# Patient Record
Sex: Male | Born: 1990 | Race: Black or African American | Hispanic: No | Marital: Single | Smoking: Current every day smoker
Health system: Southern US, Community
[De-identification: ages and names within clinical notes are randomized; demographics above are authoritative.]

---

## 2018-11-08 ENCOUNTER — Emergency Department (HOSPITAL_COMMUNITY): Payer: Self-pay

## 2018-11-08 ENCOUNTER — Encounter (HOSPITAL_COMMUNITY): Payer: Self-pay

## 2018-11-08 ENCOUNTER — Other Ambulatory Visit: Payer: Self-pay

## 2018-11-08 ENCOUNTER — Emergency Department (HOSPITAL_COMMUNITY)
Admission: EM | Admit: 2018-11-08 | Discharge: 2018-11-09 | Disposition: A | Discharge status: Home / Self Care | Payer: Self-pay | Attending: Emergency Medicine | Admitting: Emergency Medicine

## 2018-11-08 DIAGNOSIS — M79674 Pain in right toe(s): Secondary | ICD-10-CM | POA: Insufficient documentation

## 2018-11-08 DIAGNOSIS — F1424 Cocaine dependence with cocaine-induced mood disorder: Secondary | ICD-10-CM | POA: Insufficient documentation

## 2018-11-08 DIAGNOSIS — F1994 Other psychoactive substance use, unspecified with psychoactive substance-induced mood disorder: Secondary | ICD-10-CM | POA: Diagnosis present

## 2018-11-08 DIAGNOSIS — R45851 Suicidal ideations: Secondary | ICD-10-CM | POA: Insufficient documentation

## 2018-11-08 DIAGNOSIS — F329 Major depressive disorder, single episode, unspecified: Secondary | ICD-10-CM | POA: Insufficient documentation

## 2018-11-08 MED ORDER — DOXYCYCLINE HYCLATE 100 MG PO TABS
100.0000 mg | ORAL_TABLET | Freq: Once | ORAL | Status: AC
  Administered 2018-11-09: 100 mg via ORAL
  Filled 2018-11-08: qty 1

## 2018-11-08 NOTE — ED Triage Notes (Signed)
Pt reports that he is hungry. He also states that he is thinking about harming himself. No particular plan.  He also reports R foot pain. Pt is ambulatory. States that he has been out in the rain for 2 days.

## 2018-11-08 NOTE — ED Provider Notes (Signed)
[facility]-EMERGENCY DEPT Provider Note   CSN: [id number] Arrival date & time: 11/08/18  1843     History   Chief Complaint Chief Complaint  Patient presents with  . Suicidal  . Foot Pain    R    HPI Paul Campbell is a 28 y.o. male.  The history is provided by the patient. No language interpreter was used.  Foot Pain    Paul Campbell is a 28 y.o. male who presents to the Emergency Department complaining of SI. Patient presents to the emergency department voluntarily for evaluation of suicidal ideation and right foot pain. In terms of SI he states that he feels depressed and has passive suicidal thoughts. He has no plan. He is currently homeless as of a few days ago. He does have a history of prior psychiatric hospitalization one year ago when he was in [place]. He states he has been on medications in the past but is unsure what they are. He denies any medical problems. He smokes cigarettes, drinks occasional alcohol and uses crack cocaine, last use a few days ago. He states that he was in the rain a few days ago and has experienced pain to his right fourth and fifth digits, worse with walking. No fevers, chest pain, nausea, vomiting, abdominal pain, diarrhea.  History reviewed. No pertinent past medical history.  There are no active problems to display for this patient.         Home Medications    Prior to Admission medications   Not on File    Family History History reviewed. No pertinent family history.  Social History Social History   Tobacco Use  . Smoking status: Not on file  Substance Use Topics  . Alcohol use: Not on file  . Drug use: Not on file     Allergies   Patient has no known allergies.   Review of Systems Review of Systems  All other systems reviewed and are negative.    Physical Exam Updated Vital Signs BP (!) 130/91 (BP Location: Left Arm)   Pulse 79   Temp 97.8 F (36.6 C) (Oral)   Resp 18   Ht 5'  5" (1.651 m)   Wt 63.2 kg   SpO2 100%   BMI 23.20 kg/m   Physical Exam Vitals signs and nursing note reviewed.  Constitutional:      Appearance: He is well-developed.  HENT:     Head: Normocephalic and atraumatic.  Cardiovascular:     Rate and Rhythm: Normal rate and regular rhythm.  Pulmonary:     Effort: Pulmonary effort is normal. No respiratory distress.  Musculoskeletal: Normal range of motion.     Comments: 2+ DP pulses bilaterally. There are calluses and ruptured blisters to the right fourth and fifth digits in the inner digit space. There is no significant erythema or edema.  Skin:    General: Skin is warm.  Neurological:     Mental Status: He is alert and oriented to person, place, and time.  Psychiatric:     Comments: Flat affect      ED Treatments / Results  Labs (all labs ordered are listed, but only abnormal results are displayed) Labs Reviewed  COMPREHENSIVE METABOLIC PANEL  ETHANOL  SALICYLATE LEVEL  ACETAMINOPHEN LEVEL  CBC  RAPID URINE DRUG SCREEN, HOSP PERFORMED    EKG None  Radiology No results found.  Procedures Procedures (including critical care time)  Medications Ordered in ED Medications - No data  to display   Initial Impression / Assessment and Plan / ED Course  I have reviewed the triage vital signs and the nursing notes.  Pertinent labs & imaging results that were available during my care of the patient were reviewed by me and considered in my medical decision making (see chart for details).     Patient here for evaluation of suicidal ideation, no plan, history of substance abuse. He is calm and appropriate in the emergency department. He complains of pain to his right foot, he does have blisters with mild local swelling - will treat for possible early cellulitis. Counseled patient on foot hygiene as well as return precautions. He has been medically cleared for psychiatric evaluation and treatment. Patient care transferred pending  psychiatric evaluation.  Final Clinical Impressions(s) / ED Diagnoses   Final diagnoses:  None    ED Discharge Orders    None       Tilden Fossa, MD 11/09/18 [phone]

## 2018-11-09 DIAGNOSIS — F1994 Other psychoactive substance use, unspecified with psychoactive substance-induced mood disorder: Secondary | ICD-10-CM | POA: Diagnosis present

## 2018-11-09 NOTE — ED Notes (Signed)
TTS DAVE PRESENT. HE STATES PT HAS HAD NO BLOOD WORK DRAWN SINCE ARRIVAL. CHARGE MORGAN RN MADE AWARE. EDP NANAVATI MADE AWARE.

## 2018-11-09 NOTE — Discharge Instructions (Signed)
For your mental health needs, you are advised to follow up with [facility].  New and returning patients are seen at their walk-in clinic.  Walk-in hours are Monday - Friday from 8:00 am - 3:00 pm.  Walk-in patients are seen on a first come, first served basis.  Try to arrive as early as possible for the best chance of being seen the same day:       [facility]      [address]      [place], [place] [address]      [phone]  To help you maintain a sober lifestyle, a substance abuse treatment program may be beneficial to you.  Contact one of the following facilities at your earliest opportunity to ask about enrolling:  RESIDENTIAL PROGRAMS:       [facility]      [address] [place], [place] [address]      [phone]       [facility]      [address] [place], [place] [address]      [phone]       [facility]      [address]      [place], [place] [address]      [phone]  OUTPATIENT PROGRAMS:       [facility]      [address], [place] [address]      [phone]      New patients are seen at the walk-in clinic every Tuesday from 9:00 am - 12:00 pm

## 2018-11-09 NOTE — ED Provider Notes (Signed)
Care assumed from Dr. Madilyn Hook, patient with suicidal ideation pending TTS consultation.  TTS consultation is appreciated.  Patient will be held overnight for evaluation by psychiatry in the morning.   Dione Booze, MD 11/09/18 [phone]

## 2018-11-09 NOTE — BH Assessment (Signed)
[facility] Assessment Progress Note  Per Juanetta Beets, DO, this pt does not require psychiatric hospitalization at this time.  Pt is to be discharged from [facility] with recommendation to follow up with [facility] for his mental health needs.  He is also to be provided with information regarding area substance abuse treatment providers.  These resources have been included in pt's discharge instructions.  Pt would also benefit from seeing Peer Support Specialists; they will be asked to speak to pt.  Pt's nurse, Morrie Sheldon, has been notified.  Doylene Canning, MA Triage Specialist [phone]

## 2018-11-09 NOTE — BH Assessment (Addendum)
Assessment Note  Paul Campbell is an 28 y.o. male who presents to the ED voluntarily due to ongoing SI without a plan. Pt states he has been "not feeling like myself" for several weeks. Pt states he is homeless and has been sleeping outside. Pt states he has no resources or support. TTS asked the pt if he has anyone we could contact in order to obtain collateral information and the pt stated he does not have any family or friends that support him. Pt states he moved to [place] from [place] about 2 months ago. Pt does not disclose the reason he moved to [place] but states he does not have any family or support. Pt states he has been "feeling down" and states he does not feel safe. Pt states he has been admitted to inpt facilities in the past in [place] due to depression and thoughts of suicide. Pt endorses daily cocaine use and states he has been binging for the past 2 weeks.  Per Nira Conn, NP pt is recommended for continued observation for safety and stabilization and to be reassessed in the AM by psych due to SI without a definitive plan. EDP Dr. Preston Fleeting, MD and Triage staff Kendal Hymen have been advised.   Diagnosis: Unspecified depressive disorder; Cocaine use disorder, severe  Past Medical History: History reviewed. No pertinent past medical history.   Family History: History reviewed. No pertinent family history.  Social History:  has no history on file for tobacco, alcohol, and drug.  Additional Social History:  Alcohol / Drug Use Pain Medications: See MAR Prescriptions: See MAR Over the Counter: See MAR History of alcohol / drug use?: Yes Substance #1 Name of Substance 1: Cocaine 1 - Age of First Use: 25 1 - Amount (size/oz): varies 1 - Frequency: daily 1 - Duration: ongoing 1 - Last Use / Amount: 11/08/18  CIWA: CIWA-Ar BP: (!) 130/91 Pulse Rate: 79 COWS:    Allergies: No Known Allergies  Home Medications: (Not in a hospital admission)   OB/GYN Status:  No LMP for  male patient.  General Assessment Data Location of Assessment: [facility] ED TTS Assessment: In system Is this a Tele or Face-to-Face Assessment?: Face-to-Face Is this an Initial Assessment or a Re-assessment for this encounter?: Initial Assessment Patient Accompanied by:: N/A Language Other than English: No Living Arrangements: Homeless/Shelter What gender do you identify as?: Male Marital status: Single Pregnancy Status: No Living Arrangements: Alone Can pt return to current living arrangement?: Yes Admission Status: Voluntary Is patient capable of signing voluntary admission?: Yes Referral Source: Self/Family/Friend Insurance type: none     Crisis Care Plan Living Arrangements: Alone Name of Psychiatrist: none Name of Therapist: none  Education Status Is patient currently in school?: No Is the patient employed, unemployed or receiving disability?: Unemployed  Risk to self with the past 6 months Suicidal Ideation: Yes-Currently Present Has patient been a risk to self within the past 6 months prior to admission? : No Suicidal Intent: No Has patient had any suicidal intent within the past 6 months prior to admission? : No Is patient at risk for suicide?: Yes Suicidal Plan?: No Has patient had any suicidal plan within the past 6 months prior to admission? : No Access to Means: No What has been your use of drugs/alcohol within the last 12 months?: cocaine Previous Attempts/Gestures: No Triggers for Past Attempts: None known Intentional Self Injurious Behavior: None Family Suicide History: No Recent stressful life event(s): Financial Problems, Turmoil (Comment)(homeless) Persecutory voices/beliefs?: No Depression:  Yes Depression Symptoms: Insomnia, Feeling worthless/self pity Substance abuse history and/or treatment for substance abuse?: Yes Suicide prevention information given to non-admitted patients: Not applicable  Risk to Others within the past 6 months Homicidal  Ideation: No Does patient have any lifetime risk of violence toward others beyond the six months prior to admission? : No Thoughts of Harm to Others: No Current Homicidal Intent: No Current Homicidal Plan: No Access to Homicidal Means: No History of harm to others?: No Assessment of Violence: None Noted Does patient have access to weapons?: No Criminal Charges Pending?: No Does patient have a court date: No Is patient on probation?: No  Psychosis Hallucinations: None noted Delusions: None noted  Mental Status Report Appearance/Hygiene: In scrubs, Unremarkable Eye Contact: Good Motor Activity: Freedom of movement Speech: Logical/coherent Level of Consciousness: Alert Mood: Depressed Affect: Depressed Anxiety Level: None Thought Processes: Coherent, Relevant Judgement: Partial Orientation: Place, Person, Time, Situation Obsessive Compulsive Thoughts/Behaviors: None  Cognitive Functioning Concentration: Normal Memory: Recent Intact, Remote Intact Is patient IDD: No Insight: Fair Impulse Control: Good Appetite: Good Have you had any weight changes? : No Change Sleep: Decreased Total Hours of Sleep: 4 Vegetative Symptoms: None  ADLScreening [facility] Assessment Services) Patient's cognitive ability adequate to safely complete daily activities?: Yes Patient able to express need for assistance with ADLs?: Yes Independently performs ADLs?: Yes (appropriate for developmental age)  Prior Inpatient Therapy Prior Inpatient Therapy: Yes Prior Therapy Dates: 2018 Prior Therapy Facilty/Provider(s): facility in [place] Reason for Treatment: depression  Prior Outpatient Therapy Prior Outpatient Therapy: Yes Prior Therapy Dates: 2018 Prior Therapy Facilty/Provider(s): facility in [place] Reason for Treatment: MH issues Does patient have an ACCT team?: No Does patient have Intensive In-House Services?  : No Does patient have [facility] services? : No Does patient have [facility] services?:  No  ADL Screening (condition at time of admission) Patient's cognitive ability adequate to safely complete daily activities?: Yes Is the patient deaf or have difficulty hearing?: No Does the patient have difficulty seeing, even when wearing glasses/contacts?: No Does the patient have difficulty concentrating, remembering, or making decisions?: No Patient able to express need for assistance with ADLs?: Yes Does the patient have difficulty dressing or bathing?: No Independently performs ADLs?: Yes (appropriate for developmental age) Does the patient have difficulty walking or climbing stairs?: No Weakness of Legs: None Weakness of Arms/Hands: None  Home Assistive Devices/Equipment Home Assistive Devices/Equipment: None    Abuse/Neglect Assessment (Assessment to be complete while patient is alone) Abuse/Neglect Assessment Can Be Completed: Yes Physical Abuse: Denies Verbal Abuse: Denies Sexual Abuse: Denies Exploitation of patient/patient's resources: Denies Self-Neglect: Denies     Merchant navy officer (For Healthcare) Does Patient Have a Medical Advance Directive?: No Would patient like information on creating a medical advance directive?: No - Patient declined          Disposition: Per Nira Conn, NP pt is recommended for continued observation for safety and stabilization and to be reassessed in the AM by psych due to SI without a definitive plan. EDP Dr. Preston Fleeting, MD and Triage staff Kendal Hymen have been advised.  Disposition Initial Assessment Completed for this Encounter: Yes Disposition of Patient: (overnight OBS pending AM psych assessment) Patient refused recommended treatment: No  On Site Evaluation by:   Reviewed with Physician:    Karolee Ohs 11/09/2018 1:06 AM

## 2018-11-09 NOTE — Progress Notes (Signed)
Received Paul Campbell from [facility] alert and immediately went to bed. He continued to sleep throughout since his arrival to the unit.

## 2018-11-09 NOTE — ED Notes (Signed)
Patient is alert and oriented to person, place and time.  Patient currently denies suicidal thoughts, auditory and visual hallucinations.  Routine safety checks maintained every 15 minutes and via security camera.  Discharge instructions reviewed with patient.  Verbalizes understanding and readiness for discharge.

## 2018-11-09 NOTE — Progress Notes (Signed)
Per Nira Conn, NP pt is recommended for continued observation for safety and stabilization and to be reassessed in the AM by psych due to SI without a definitive plan. EDP Dr. Preston Fleeting, MD and Triage staff Kendal Hymen have been advised.   Princess Bruins, MSW, LCSW Therapeutic Triage Specialist  [phone]

## 2018-11-09 NOTE — Patient Outreach (Signed)
ED Peer Support Specialist Patient Intake (Complete at intake & 30-60 Day Follow-up)  Name: Paul Campbell  MRN: [id number]  Age: 28 y.o.   Date of Admission: 11/09/2018  Intake: Initial Comments:      Primary Reason Admitted: Suicidal, Foot Pain   Lab values: Alcohol/ETOH: Not completed Positive UDS? Drug screen not completed Amphetamines: Drug screen not completed Barbiturates: Drug screen not completed Benzodiazepines: Drug screen not completed Cocaine: Drug screen not completed Opiates: Drug screen not completed Cannabinoids: Drug screen not completed  Demographic information: Gender: Male Ethnicity: African American Marital Status: Single Insurance Status: Diplomatic Services operational officer (Work Neurosurgeon, Physicist, medical, etc.: No Lives with: Alone Living situation: House/Apartment  Reported Patient History: Patient reported health conditions: Depression Patient aware of HIV and hepatitis status: No  In past year, has patient visited ED for any reason? No  Number of ED visits:    Reason(s) for visit:    In past year, has patient been hospitalized for any reason? No  Number of hospitalizations:    Reason(s) for hospitalization:    In past year, has patient been arrested? No  Number of arrests:    Reason(s) for arrest:    In past year, has patient been incarcerated? No  Number of incarcerations:    Reason(s) for incarceration:    In past year, has patient received medication-assisted treatment? No  In past year, patient received the following treatments:    In past year, has patient received any harm reduction services? No  Did this include any of the following?    In past year, has patient received care from a mental health provider for diagnosis other than SUD? No  In past year, is this first time patient has overdosed? No  Number of past overdoses:    In past year, is this first time patient has been hospitalized for  an overdose? No  Number of hospitalizations for overdose(s):    Is patient currently receiving treatment for a mental health diagnosis? No  Patient reports experiencing difficulty participating in SUD treatment: No    Most important reason(s) for this difficulty?    Has patient received prior services for treatment? No  In past, patient has received services from following agencies:    Plan of Care:  Suggested follow up at these agencies/treatment centers: Other (comment)(Wants to fell better about himself.)  Other information: CPSS met with Pt an was able to process with Pt to gain information to better assist Pt. CPSS was made aware that Pt had thoughts but does not have a plan to harm himself. Pt discussed the fact that he had spoke about harming himself but does not really want to do so. Pt was made aware that he will receive contact information for outpatient services and resources that will benefit Pt in the community.    Aaron Edelman Lorene Samaan, CPSS  11/09/2018 11:11 AM

## 2018-11-09 NOTE — ED Notes (Signed)
Pt c/o left foot pain, MD aware. Pt denies any hi and avh at this time. Pt c/o of si without a plan. Pt contract to safety. Pt cooperative and calm. Pt safe, will continue to monitor.

## 2018-11-09 NOTE — ED Notes (Signed)
PSYCH DO AND TEAM  Provider at bedside. 

## 2018-11-09 NOTE — ED Notes (Signed)
Bed: WA32 Expected date:  Expected time:  Means of arrival:  Comments: 

## 2018-11-09 NOTE — B[facility] Suicide Risk Assessment (Signed)
Suicide Risk Assessment  Discharge Assessment   [facility] Discharge Suicide Risk Assessment   Principal Problem: Substance induced mood disorder ([place]) Discharge Diagnoses: Principal Problem:   Substance induced mood disorder ([place])   Total Time spent with patient: 45 minutes  Musculoskeletal: Strength & Muscle Tone: within normal limits Gait & Station: normal Patient leans: N/A  Psychiatric Specialty Exam:   Blood pressure 117/74, pulse (!) 52, temperature 98 F (36.7 C), temperature source Oral, resp. rate 15, height _0  (1.651 m), weight 63.2 kg, SpO2 99 %.Body mass index is 23.2 kg/m.  General Appearance: Casual  Eye Contact::  Good  Speech:  Normal Rate409  Volume:  Normal  Mood:  Irritable  Affect:  Congruent  Thought Process:  Coherent and Descriptions of Associations: Intact  Orientation:  Full (Time, Place, and Person)  Thought Content:  WDL and Logical  Suicidal Thoughts:  No  Homicidal Thoughts:  No  Memory:  Immediate;   Good Recent;   Good Remote;   Good  Judgement:  Fair  Insight:  Fair  Psychomotor Activity:  Normal  Concentration:  Good  Recall:  Good  Fund of Knowledge:Good  Language: Good  Akathisia:  No  Handed:  Right  AIMS (if indicated):     Assets:  Leisure Time Physical Health Resilience  Sleep:     Cognition: WNL  ADL's:  Intact   Mental Status Per Nursing Assessment::   On Admission:   28 yo male who presented to ED for foot pain after using drugs, refused an UDS, and having passive suicidal ideations.  No hallucinations or withdrawal symptoms.  On assessment, his suicidal ideations were intermittent, passive with no intent or plan.  Denies past attempts.  Met with Peer support.  Irritable on assessment.  Foot was assessed and cleared by the EP, stable psychiatrically.  Encouraged to follow up with rehab/recovery resources.  Demographic Factors:  Male and Adolescent or young adult  Loss Factors: Legal issues  Historical  Factors: NA  Risk Reduction Factors:   Sense of responsibility to family, Positive social support and Positive therapeutic relationship  Continued Clinical Symptoms:  Irritable   Cognitive Features That Contribute To Risk:  None    Suicide Risk:  Minimal: No identifiable suicidal ideation.  Patients presenting with no risk factors but with morbid ruminations; may be classified as minimal risk based on the severity of the depressive symptoms    Plan Of Care/Follow-up recommendations:  Activity:  as tolerated Diet:  heart healthy diet  Helga Asbury, NP 11/09/2018, 11:39 AM

## 2018-11-13 ENCOUNTER — Encounter (HOSPITAL_COMMUNITY): Payer: Self-pay | Admitting: Emergency Medicine

## 2018-11-13 ENCOUNTER — Other Ambulatory Visit: Payer: Self-pay

## 2018-11-13 ENCOUNTER — Emergency Department (HOSPITAL_COMMUNITY): Payer: Self-pay

## 2018-11-13 ENCOUNTER — Emergency Department (HOSPITAL_COMMUNITY)
Admission: EM | Admit: 2018-11-13 | Discharge: 2018-11-13 | Disposition: A | Discharge status: Home / Self Care | Payer: Self-pay | Attending: Emergency Medicine | Admitting: Emergency Medicine

## 2018-11-13 DIAGNOSIS — R079 Chest pain, unspecified: Secondary | ICD-10-CM | POA: Insufficient documentation

## 2018-11-13 DIAGNOSIS — F1490 Cocaine use, unspecified, uncomplicated: Secondary | ICD-10-CM | POA: Insufficient documentation

## 2018-11-13 DIAGNOSIS — F17200 Nicotine dependence, unspecified, uncomplicated: Secondary | ICD-10-CM | POA: Insufficient documentation

## 2018-11-13 LAB — BASIC METABOLIC PANEL
ANION GAP: 12 (ref 5–15)
BUN: 14 mg/dL (ref 6–20)
CO2: 24 mmol/L (ref 22–32)
Calcium: 9 mg/dL (ref 8.9–10.3)
Chloride: 100 mmol/L (ref 98–111)
Creatinine, Ser: 0.95 mg/dL (ref 0.61–1.24)
GFR calc Af Amer: >60 mL/min (ref >60)
GFR calc non Af Amer: >60 mL/min (ref >60)
Glucose, Bld: 85 mg/dL (ref 70–99)
Potassium: 3.5 mmol/L (ref 3.5–5.1)
Sodium: 136 mmol/L (ref 135–145)

## 2018-11-13 LAB — CBC WITH DIFFERENTIAL/PLATELET
ABS IMMATURE GRANULOCYTES: 0.02 10*3/uL (ref 0.00–0.07)
Basophils Absolute: 0 10*3/uL (ref 0.0–0.1)
Basophils Relative: 0 %
Eosinophils Absolute: 0.1 10*3/uL (ref 0.0–0.5)
Eosinophils Relative: 1 %
HCT: 41.3 % (ref 39.0–52.0)
Hemoglobin: 13.6 g/dL (ref 13.0–17.0)
Immature Granulocytes: 0 %
LYMPHS ABS: 2.5 10*3/uL (ref 0.7–4.0)
Lymphocytes Relative: 34 %
MCH: 30.7 pg (ref 26.0–34.0)
MCHC: 32.9 g/dL (ref 30.0–36.0)
MCV: 93.2 fL (ref 80.0–100.0)
Monocytes Absolute: 0.5 10*3/uL (ref 0.1–1.0)
Monocytes Relative: 7 %
Neutro Abs: 4.3 10*3/uL (ref 1.7–7.7)
Neutrophils Relative %: 58 %
Platelets: 249 10*3/uL (ref 150–400)
RBC: 4.43 MIL/uL (ref 4.22–5.81)
RDW: 13.9 % (ref 11.5–15.5)
WBC: 7.5 10*3/uL (ref 4.0–10.5)
nRBC: 0 % (ref 0.0–0.2)

## 2018-11-13 LAB — I-STAT TROPONIN, ED: Troponin i, poc: 0.01 ng/mL (ref 0.00–0.08)

## 2018-11-13 LAB — RAPID URINE DRUG SCREEN, HOSP PERFORMED
Amphetamines: NOT DETECTED
Barbiturates: NOT DETECTED
Benzodiazepines: NOT DETECTED
COCAINE: POSITIVE — AB
Opiates: NOT DETECTED
Tetrahydrocannabinol: NOT DETECTED

## 2018-11-13 LAB — SALICYLATE LEVEL: Salicylate Lvl: <7 mg/dL (ref 2.8–30.0)

## 2018-11-13 LAB — ETHANOL: Alcohol, Ethyl (B): 13 mg/dL — ABNORMAL HIGH (ref <10)

## 2018-11-13 NOTE — Discharge Instructions (Signed)
Like we discussed, highly recommend to stop smoking and using cocaine.  Both of these are very bad for your heart. Follow-up with your primary care doctor. Return here for any new/acute changes.

## 2018-11-13 NOTE — ED Provider Notes (Signed)
[facility] EMERGENCY DEPARTMENT Provider Note   CSN: [id number] Arrival date & time: 11/13/18  0430     History   Chief Complaint Chief Complaint  Patient presents with  . Chest Pain    HPI Paul Campbell is a 28 y.o. male.  The history is provided by the patient and medical records.  Chest Pain     28 year old male with history of substance-induced mood disorder, presenting to the ED with chest pain.  This began just a few hours ago.  Midsternal in nature, described as a "pressure".  He denies any radiation of pain.  No shortness of breath, palpitations, dizziness, diaphoresis, nausea, or vomiting.  He has no known cardiac history.  Does have family cardiac history--reports several family members have passed away from heart attacks and cardiac complications.  He does smoke on a daily basis, about 5 cigarettes a day.  Also admits to occasional use of cocaine, he did use a few hours ago prior to onset of pain.  States usually no chest pain after doing cocaine.  Also admits he drank 1 beer.  History reviewed. No pertinent past medical history.  Patient Active Problem List   Diagnosis Date Noted  . Substance induced mood disorder (HCC) 11/09/2018    History reviewed. No pertinent surgical history.      Home Medications    Prior to Admission medications   Not on File    Family History No family history on file.  Social History Social History   Tobacco Use  . Smoking status: Current Every Day Smoker  . Smokeless tobacco: Never Used  Substance Use Topics  . Alcohol use: Yes  . Drug use: Yes    Types: Cocaine     Allergies   Patient has no known allergies.   Review of Systems Review of Systems  Cardiovascular: Positive for chest pain.  All other systems reviewed and are negative.    Physical Exam Updated Vital Signs BP 116/85 (BP Location: Right Arm)   Pulse (!) 56   Temp 97.6 F (36.4 C) (Oral)   Resp 16   SpO2 100%    Physical Exam Vitals signs and nursing note reviewed.  Constitutional:      Appearance: He is well-developed.  HENT:     Head: Normocephalic and atraumatic.  Eyes:     Conjunctiva/sclera: Conjunctivae normal.     Pupils: Pupils are equal, round, and reactive to light.  Neck:     Musculoskeletal: Normal range of motion.  Cardiovascular:     Rate and Rhythm: Normal rate and regular rhythm.     Heart sounds: Normal heart sounds.  Pulmonary:     Effort: Pulmonary effort is normal. No accessory muscle usage.     Breath sounds: Normal breath sounds. No decreased breath sounds or wheezing.  Abdominal:     General: Bowel sounds are normal.     Palpations: Abdomen is soft.  Musculoskeletal: Normal range of motion.  Skin:    General: Skin is warm and dry.  Neurological:     Mental Status: He is alert and oriented to person, place, and time.      ED Treatments / Results  Labs (all labs ordered are listed, but only abnormal results are displayed) Labs Reviewed  ETHANOL - Abnormal; Notable for the following components:      Result Value   Alcohol, Ethyl (B) 13 (*)    All other components within normal limits  RAPID URINE DRUG SCREEN, HOSP  PERFORMED - Abnormal; Notable for the following components:   Cocaine POSITIVE (*)    All other components within normal limits  CBC WITH DIFFERENTIAL/PLATELET  BASIC METABOLIC PANEL  SALICYLATE LEVEL  I-STAT TROPONIN, ED    EKG None  Radiology Dg Chest 2 View  Result Date: 11/13/2018 CLINICAL DATA:  Chest pain EXAM: CHEST - 2 VIEW COMPARISON:  None. FINDINGS: Normal heart size and mediastinal contours. No acute infiltrate or edema. No effusion or pneumothorax. No acute osseous findings. IMPRESSION: Negative chest. Electronically Signed   By: Marnee Spring M.D.   On: 11/13/2018 05:26    Procedures Procedures (including critical care time)  Medications Ordered in ED Medications - No data to display   Initial Impression /  Assessment and Plan / ED Course  I have reviewed the triage vital signs and the nursing notes.  Pertinent labs & imaging results that were available during my care of the patient were reviewed by me and considered in my medical decision making (see chart for details).  28 year old male here with chest pain.  Began last evening.  Does report using cocaine prior to onset of pain.  He uses cocaine fairly often but usually without chest pain.  EKG non-ischemic, looks to be early repolarization.  VSS.  Labs pending along with CXR.  Will monitor.  6:25 AM Labs overall reassuring-- trop negative.  Ethanol 13.  UDS + for cocaine.  CXR clear.  On recheck, patient resting comfortably in room.  He is in no acute distress.  Vitals remained stable.  Results discussed with him, he feels reassured.  He is requesting sandwich.  Discussed cessation of cocaine and smoking.  Can follow-up with PCP.   Return here for any new/acute changes.  Final Clinical Impressions(s) / ED Diagnoses   Final diagnoses:  Chest pain in adult  Cocaine use    ED Discharge Orders    None       Garlon Hatchet, PA-C 11/13/18 0712    Nicanor Alcon, April, MD 11/13/18 [phone]

## 2018-11-13 NOTE — ED Triage Notes (Signed)
Pt BIB [facility], c/o sudden onset chest pain while walking tonight. Also admits to using cocaine tonight. Given 324mg  asa and 2 NTG PTA. Pain from a 10 to five after NTG.

## 2018-11-15 ENCOUNTER — Emergency Department (HOSPITAL_COMMUNITY): Payer: Self-pay

## 2018-11-15 ENCOUNTER — Encounter (HOSPITAL_COMMUNITY): Payer: Self-pay | Admitting: Emergency Medicine

## 2018-11-15 ENCOUNTER — Emergency Department (HOSPITAL_COMMUNITY)
Admission: EM | Admit: 2018-11-15 | Discharge: 2018-11-15 | Disposition: A | Discharge status: Home / Self Care | Payer: Self-pay | Attending: Emergency Medicine | Admitting: Emergency Medicine

## 2018-11-15 ENCOUNTER — Other Ambulatory Visit: Payer: Self-pay

## 2018-11-15 DIAGNOSIS — F1994 Other psychoactive substance use, unspecified with psychoactive substance-induced mood disorder: Secondary | ICD-10-CM | POA: Insufficient documentation

## 2018-11-15 DIAGNOSIS — R0789 Other chest pain: Secondary | ICD-10-CM | POA: Insufficient documentation

## 2018-11-15 DIAGNOSIS — Z5900 Homelessness unspecified: Secondary | ICD-10-CM

## 2018-11-15 DIAGNOSIS — F17200 Nicotine dependence, unspecified, uncomplicated: Secondary | ICD-10-CM | POA: Insufficient documentation

## 2018-11-15 LAB — BASIC METABOLIC PANEL
Anion gap: 8 (ref 5–15)
BUN: 14 mg/dL (ref 6–20)
CALCIUM: 8.8 mg/dL — AB (ref 8.9–10.3)
CO2: 26 mmol/L (ref 22–32)
Chloride: 103 mmol/L (ref 98–111)
Creatinine, Ser: 0.83 mg/dL (ref 0.61–1.24)
GFR calc Af Amer: >60 mL/min (ref >60)
GFR calc non Af Amer: >60 mL/min (ref >60)
Glucose, Bld: 86 mg/dL (ref 70–99)
Potassium: 3.7 mmol/L (ref 3.5–5.1)
Sodium: 137 mmol/L (ref 135–145)

## 2018-11-15 LAB — POCT I-STAT TROPONIN I: Troponin i, poc: 0 ng/mL (ref 0.00–0.08)

## 2018-11-15 LAB — CBC
HCT: 41.3 % (ref 39.0–52.0)
Hemoglobin: 13.7 g/dL (ref 13.0–17.0)
MCH: 31.1 pg (ref 26.0–34.0)
MCHC: 33.2 g/dL (ref 30.0–36.0)
MCV: 93.9 fL (ref 80.0–100.0)
Platelets: 273 10*3/uL (ref 150–400)
RBC: 4.4 MIL/uL (ref 4.22–5.81)
RDW: 14 % (ref 11.5–15.5)
WBC: 6.9 10*3/uL (ref 4.0–10.5)
nRBC: 0 % (ref 0.0–0.2)

## 2018-11-15 LAB — RAPID URINE DRUG SCREEN, HOSP PERFORMED
Amphetamines: NOT DETECTED
Barbiturates: NOT DETECTED
Benzodiazepines: NOT DETECTED
Cocaine: POSITIVE — AB
Opiates: NOT DETECTED
Tetrahydrocannabinol: POSITIVE — AB

## 2018-11-15 MED ORDER — SODIUM CHLORIDE 0.9% FLUSH: 3.0000 mL | Freq: Once | INTRAVENOUS | Status: DC

## 2018-11-15 MED ORDER — IBUPROFEN 200 MG PO TABS
600.0000 mg | ORAL_TABLET | Freq: Once | ORAL | Status: AC
  Administered 2018-11-15: 600 mg via ORAL
  Filled 2018-11-15: qty 3

## 2018-11-15 NOTE — Discharge Instructions (Addendum)
Please stop doing cocaine.  You can take ibuprofen 600 mg 4 times a day for pain.  Please look at the resource guide to help you find a place to stay while you are homeless.

## 2018-11-15 NOTE — ED Notes (Signed)
Pt informed by MD that he cannot have anything to eat right now

## 2018-11-15 NOTE — ED Notes (Signed)
Urine and culture sent to lab  

## 2018-11-15 NOTE — ED Provider Notes (Signed)
[facility]   CSN: [id number] Arrival date & time: 11/15/18  0416  Time seen 5:10 AM   History   Chief Complaint Chief Complaint  Patient presents with  . Chest Pain    HPI Paul Campbell is a 28 y.o. male.  HPI patient states he was walking outside around 1 AM because he is homeless and he had pain in the center of his chest that he describes as sharp.  He denies shortness of breath or diaphoresis or nausea or vomiting.  Patient states "my chest always hurts".  He denies using any cocaine tonight but and when he was seen in the ED on February 14 he was having chest pain after using cocaine.  He states "I know I have heart problems because my family does".  When asking what kind of heart problems he thinks he has he cannot answer.  Patient states he does not do cocaine on a regular basis.  Patient states he has been homeless about a week.  This is his third ED visit in 1 week.  He states he moved down here from [place] about a month ago and was staying with a friend however it did not work out.  He states he has had jobs in the past in [place] but he is currently unemployed.  He denies being on disability.  He states he does not want to answer these kind of questions however I pointed out to him he has been here 3 times in a week and we need to know what is  going on.  PCP Patient, No Pcp Per   History reviewed. No pertinent past medical history.  Patient Active Problem List   Diagnosis Date Noted  . Substance induced mood disorder (HCC) 11/09/2018    History reviewed. No pertinent surgical history.      Home Medications    Prior to Admission medications   Not on File    Family History History reviewed. No pertinent family history.  Social History Social History   Tobacco Use  . Smoking status: Current Every Day Smoker  . Smokeless tobacco: Never Used  Substance Use Topics  . Alcohol use: Yes  . Drug  use: Yes    Types: Cocaine  unemployed  Homeless    Allergies   Patient has no known allergies.   Review of Systems Review of Systems  All other systems reviewed and are negative.    Physical Exam Updated Vital Signs BP 131/77 (BP Location: Left Arm)   Pulse (!) 54   Temp 97.7 F (36.5 C) (Oral)   Resp 18   Ht 5\' 5"  (1.651 m)   Wt 63.5 kg   SpO2 98%   BMI 23.30 kg/m   Physical Exam Vitals signs and nursing note reviewed.  Constitutional:      General: He is not in acute distress.    Appearance: Normal appearance. He is well-developed. He is not ill-appearing or toxic-appearing.     Comments: Patient is in no distress watching TV.  HENT:     Head: Normocephalic and atraumatic.     Right Ear: External ear normal.     Left Ear: External ear normal.     Nose: Nose normal. No mucosal edema or rhinorrhea.     Mouth/Throat:     Dentition: No dental abscesses.     Pharynx: No uvula swelling.  Eyes:     Conjunctiva/sclera: Conjunctivae normal.     Pupils:  Pupils are equal, round, and reactive to light.  Neck:     Musculoskeletal: Full passive range of motion without pain, normal range of motion and neck supple.  Cardiovascular:     Rate and Rhythm: Normal rate and regular rhythm.     Heart sounds: Normal heart sounds. No murmur. No friction rub. No gallop.   Pulmonary:     Effort: Pulmonary effort is normal. No respiratory distress.     Breath sounds: Normal breath sounds. No wheezing, rhonchi or rales.  Chest:     Chest wall: Tenderness present. No crepitus.    Abdominal:     General: Bowel sounds are normal. There is no distension.     Palpations: Abdomen is soft.     Tenderness: There is no abdominal tenderness. There is no guarding or rebound.  Musculoskeletal: Normal range of motion.        General: No tenderness.     Comments: Moves all extremities well.   Skin:    General: Skin is warm and dry.     Coloration: Skin is not pale.     Findings: No  erythema or rash.  Neurological:     General: No focal deficit present.     Mental Status: He is alert and oriented to person, place, and time.     Cranial Nerves: No cranial nerve deficit.  Psychiatric:        Mood and Affect: Mood normal. Mood is not anxious.        Speech: Speech normal.        Behavior: Behavior normal.        Thought Content: Thought content normal.      ED Treatments / Results  Labs (all labs ordered are listed, but only abnormal results are displayed) Results for orders placed or performed during the hospital encounter of 11/15/18  Basic metabolic panel  Result Value Ref Range   Sodium 137 135 - 145 mmol/L   Potassium 3.7 3.5 - 5.1 mmol/L   Chloride 103 98 - 111 mmol/L   CO2 26 22 - 32 mmol/L   Glucose, Bld 86 70 - 99 mg/dL   BUN 14 6 - 20 mg/dL   Creatinine, Ser 2.23 0.61 - 1.24 mg/dL   Calcium 8.8 (L) 8.9 - 10.3 mg/dL   GFR calc non Af Amer >60 >60 mL/min   GFR calc Af Amer >60 >60 mL/min   Anion gap 8 5 - 15  CBC  Result Value Ref Range   WBC 6.9 4.0 - 10.5 K/uL   RBC 4.40 4.22 - 5.81 MIL/uL   Hemoglobin 13.7 13.0 - 17.0 g/dL   HCT 36.1 22.4 - 49.7 %   MCV 93.9 80.0 - 100.0 fL   MCH 31.1 26.0 - 34.0 pg   MCHC 33.2 30.0 - 36.0 g/dL   RDW 53.0 05.1 - 10.2 %   Platelets 273 150 - 400 K/uL   nRBC 0.0 0.0 - 0.2 %  Urine rapid drug screen (hosp performed)  Result Value Ref Range   Opiates NONE DETECTED NONE DETECTED   Cocaine POSITIVE (A) NONE DETECTED   Benzodiazepines NONE DETECTED NONE DETECTED   Amphetamines NONE DETECTED NONE DETECTED   Tetrahydrocannabinol POSITIVE (A) NONE DETECTED   Barbiturates NONE DETECTED NONE DETECTED  POCT i-Stat troponin I  Result Value Ref Range   Troponin i, poc 0.00 0.00 - 0.08 ng/mL   Comment 3           Laboratory interpretation all normal  except +UDS    EKG EKG Interpretation  Date/Time:  Sunday November 15 2018 04:25:26 EST Ventricular Rate:  52 PR Interval:    QRS Duration: 87 QT  Interval:  441 QTC Calculation: 411 R Axis:   78 Text Interpretation:  Sinus rhythm LVH by voltage No significant change since last tracing 13 Nov 2018 Confirmed by Devoria Albe ([id number]) on 11/15/2018 4:38:58 AM   Radiology Dg Chest 2 View  Result Date: 11/15/2018 CLINICAL DATA:  Initial evaluation for acute chest pressure. EXAM: CHEST - 2 VIEW COMPARISON:  Prior radiograph from 11/13/2018 FINDINGS: The cardiac and mediastinal silhouettes are stable in size and contour, and remain within normal limits. The lungs are normally inflated. No airspace consolidation, pleural effusion, or pulmonary edema is identified. There is no pneumothorax. No acute osseous abnormality identified. IMPRESSION: No active cardiopulmonary disease. Electronically Signed   By: Rise Mu M.D.   On: 11/15/2018 04:47    Procedures Procedures (including critical care time)  Medications Ordered in ED Medications  sodium chloride flush (NS) 0.9 % injection 3 mL (has no administration in time range)  ibuprofen (ADVIL,MOTRIN) tablet 600 mg (600 mg Oral Given 11/15/18 0541)     Initial Impression / Assessment and Plan / ED Course  I have reviewed the triage vital signs and the nursing notes.  Pertinent labs & imaging results that were available during my care of the patient were reviewed by me and considered in my medical decision making (see chart for details).     Patient had already had a cardiac evaluation on the 14th.  His initial troponin here is negative and is a 4-hour sample.  His chest pain is reproducible, I am going to discharge patient.  Please note that his main concern was getting something to eat.  Final Clinical Impressions(s) / ED Diagnoses   Final diagnoses:  Chest wall pain  Homelessness    Plan discharge  Devoria Albe, MD, [facility]   ED Discharge Orders    None       Devoria Albe, MD 11/15/18 [phone]

## 2018-11-15 NOTE — ED Notes (Addendum)
Pt alert and ambulatory. Given and reviewed discharge instructions. Pt threw discharge instructions across the room. Pt informed that he needs to stop using cocaine which upset pt

## 2018-11-15 NOTE — ED Notes (Addendum)
Pt requesting a sandwich. Informed he cannot have one until a provider speaks with him and his lab work/ xray comes back.

## 2018-11-15 NOTE — ED Notes (Addendum)
Pt walked out the department screaming "yall [email or web address] all fucking liars"

## 2018-11-16 ENCOUNTER — Emergency Department (HOSPITAL_COMMUNITY)
Admission: EM | Admit: 2018-11-16 | Discharge: 2018-11-16 | Disposition: A | Discharge status: Home / Self Care | Payer: Self-pay | Attending: Emergency Medicine | Admitting: Emergency Medicine

## 2018-11-16 ENCOUNTER — Emergency Department (HOSPITAL_COMMUNITY): Payer: Self-pay

## 2018-11-16 ENCOUNTER — Encounter (HOSPITAL_COMMUNITY): Payer: Self-pay

## 2018-11-16 DIAGNOSIS — F17200 Nicotine dependence, unspecified, uncomplicated: Secondary | ICD-10-CM | POA: Insufficient documentation

## 2018-11-16 DIAGNOSIS — M79671 Pain in right foot: Secondary | ICD-10-CM | POA: Insufficient documentation

## 2018-11-16 MED ORDER — IBUPROFEN 800 MG PO TABS: 800.0000 mg | ORAL_TABLET | Freq: Three times a day (TID) | ORAL | 0 refills | Status: DC | PRN

## 2018-11-16 NOTE — ED Notes (Signed)
ED Provider at bedside. 

## 2018-11-16 NOTE — ED Triage Notes (Signed)
Pt presents with c/o right foot pain. Pt reports that his foot has been bothering him for approx one month, pain with walking, no injuries.

## 2018-11-16 NOTE — Discharge Instructions (Addendum)
REturn here as needed. Follow up with the foot specialist provided. Your x-rays were normal.

## 2018-11-16 NOTE — ED Provider Notes (Signed)
[facility]-EMERGENCY DEPT Provider Note   CSN: [id number] Arrival date & time: 11/16/18  0827     History   Chief Complaint Chief Complaint  Patient presents with  . Foot Pain    HPI Paul Campbell is a 28 y.o. male.  HPI Patient presents to the emergency department with right foot pain at the base of the fourth and fifth toes.  The patient states that he has been bothering him for over a month.  Patient states that he is not had any injuries noted.  The patient states that palpation and certain movements make the pain worse.  The patient states he did not take any medications prior to arrival for his symptoms.  Patient denies any fevers, nausea, vomiting, back pain, weakness or syncope.Marland Kitchen History reviewed. No pertinent past medical history.  Patient Active Problem List   Diagnosis Date Noted  . Substance induced mood disorder (HCC) 11/09/2018    History reviewed. No pertinent surgical history.      Home Medications    Prior to Admission medications   Not on File    Family History History reviewed. No pertinent family history.  Social History Social History   Tobacco Use  . Smoking status: Current Every Day Smoker  . Smokeless tobacco: Never Used  Substance Use Topics  . Alcohol use: Yes  . Drug use: Yes    Types: Cocaine     Allergies   Patient has no known allergies.   Review of Systems Review of Systems All other systems negative except as documented in the HPI. All pertinent positives and negatives as reviewed in the HPI.  Physical Exam Updated Vital Signs BP 126/89 (BP Location: Left Arm)   Pulse (!) 55   Temp 97.7 F (36.5 C) (Oral)   Resp 18   Ht 5\' 5"  (1.651 m)   Wt 61.2 kg   SpO2 100%   BMI 22.47 kg/m   Physical Exam Vitals signs and nursing note reviewed.  Constitutional:      General: He is not in acute distress.    Appearance: He is well-developed.  HENT:     Head: Normocephalic and atraumatic.  Eyes:      Pupils: Pupils are equal, round, and reactive to light.  Pulmonary:     Effort: Pulmonary effort is normal.  Musculoskeletal:       Feet:  Skin:    General: Skin is warm and dry.  Neurological:     Mental Status: He is alert and oriented to person, place, and time.      ED Treatments / Results  Labs (all labs ordered are listed, but only abnormal results are displayed) Labs Reviewed - No data to display  EKG None  Radiology Dg Chest 2 View  Result Date: 11/15/2018 CLINICAL DATA:  Initial evaluation for acute chest pressure. EXAM: CHEST - 2 VIEW COMPARISON:  Prior radiograph from 11/13/2018 FINDINGS: The cardiac and mediastinal silhouettes are stable in size and contour, and remain within normal limits. The lungs are normally inflated. No airspace consolidation, pleural effusion, or pulmonary edema is identified. There is no pneumothorax. No acute osseous abnormality identified. IMPRESSION: No active cardiopulmonary disease. Electronically Signed   By: Rise Mu M.D.   On: 11/15/2018 04:47   Dg Foot Complete Right  Result Date: 11/16/2018 CLINICAL DATA:  Lateral foot pain for 1 month.  No injury. EXAM: RIGHT FOOT COMPLETE - 3+ VIEW COMPARISON:  11/08/2018 FINDINGS: There is no evidence of fracture or  dislocation. There is no evidence of arthropathy or other focal bone abnormality. Soft tissues are unremarkable. IMPRESSION: Negative. Electronically Signed   By: Amie Portland M.D.   On: 11/16/2018 09:42    Procedures Procedures (including critical care time)  Medications Ordered in ED Medications - No data to display   Initial Impression / Assessment and Plan / ED Course  I have reviewed the triage vital signs and the nursing notes.  Pertinent labs & imaging results that were available during my care of the patient were reviewed by me and considered in my medical decision making (see chart for details).    Patient is referred to podiatry.  Have advised him to  keep the area elevated and ice as well.  Motrin for pain.  Final Clinical Impressions(s) / ED Diagnoses   Final diagnoses:  None    ED Discharge Orders    None       Charlestine Night, PA-C 11/20/18 2230    Melene Plan, DO 11/20/18 2258

## 2018-11-17 ENCOUNTER — Emergency Department (HOSPITAL_COMMUNITY): Admission: EM | Admit: 2018-11-17 | Discharge: 2018-11-17 | Discharge status: Absent without leave | Payer: Self-pay

## 2018-11-17 ENCOUNTER — Encounter (HOSPITAL_COMMUNITY): Payer: Self-pay

## 2018-11-17 ENCOUNTER — Emergency Department (HOSPITAL_COMMUNITY)
Admission: EM | Admit: 2018-11-17 | Discharge: 2018-11-17 | Disposition: A | Discharge status: Home / Self Care | Payer: Self-pay | Attending: Emergency Medicine | Admitting: Emergency Medicine

## 2018-11-17 DIAGNOSIS — Z008 Encounter for other general examination: Secondary | ICD-10-CM | POA: Insufficient documentation

## 2018-11-17 DIAGNOSIS — Z5900 Homelessness unspecified: Secondary | ICD-10-CM

## 2018-11-17 DIAGNOSIS — F329 Major depressive disorder, single episode, unspecified: Secondary | ICD-10-CM | POA: Insufficient documentation

## 2018-11-17 DIAGNOSIS — F17200 Nicotine dependence, unspecified, uncomplicated: Secondary | ICD-10-CM | POA: Insufficient documentation

## 2018-11-17 DIAGNOSIS — F32A Depression, unspecified: Secondary | ICD-10-CM

## 2018-11-17 DIAGNOSIS — F1410 Cocaine abuse, uncomplicated: Secondary | ICD-10-CM | POA: Insufficient documentation

## 2018-11-17 LAB — COMPREHENSIVE METABOLIC PANEL
ALK PHOS: 63 U/L (ref 38–126)
ALT: 22 U/L (ref 0–44)
AST: 35 U/L (ref 15–41)
Albumin: 4.4 g/dL (ref 3.5–5.0)
Anion gap: 9 (ref 5–15)
BUN: 17 mg/dL (ref 6–20)
CO2: 23 mmol/L (ref 22–32)
Calcium: 8.8 mg/dL — ABNORMAL LOW (ref 8.9–10.3)
Chloride: 102 mmol/L (ref 98–111)
Creatinine, Ser: 0.96 mg/dL (ref 0.61–1.24)
GFR calc Af Amer: >60 mL/min (ref >60)
GFR calc non Af Amer: >60 mL/min (ref >60)
GLUCOSE: 73 mg/dL (ref 70–99)
Potassium: 3.7 mmol/L (ref 3.5–5.1)
Sodium: 134 mmol/L — ABNORMAL LOW (ref 135–145)
Total Bilirubin: 1.2 mg/dL (ref 0.3–1.2)
Total Protein: 7.3 g/dL (ref 6.5–8.1)

## 2018-11-17 LAB — CBC
HEMATOCRIT: 42.5 % (ref 39.0–52.0)
Hemoglobin: 14.3 g/dL (ref 13.0–17.0)
MCH: 31.9 pg (ref 26.0–34.0)
MCHC: 33.6 g/dL (ref 30.0–36.0)
MCV: 94.9 fL (ref 80.0–100.0)
Platelets: 276 10*3/uL (ref 150–400)
RBC: 4.48 MIL/uL (ref 4.22–5.81)
RDW: 14.1 % (ref 11.5–15.5)
WBC: 8.3 10*3/uL (ref 4.0–10.5)
nRBC: 0 % (ref 0.0–0.2)

## 2018-11-17 LAB — ACETAMINOPHEN LEVEL: Acetaminophen (Tylenol), Serum: <10 ug/mL — ABNORMAL LOW (ref 10–30)

## 2018-11-17 LAB — SALICYLATE LEVEL: Salicylate Lvl: <7 mg/dL (ref 2.8–30.0)

## 2018-11-17 LAB — ETHANOL: Alcohol, Ethyl (B): <10 mg/dL (ref <10)

## 2018-11-17 NOTE — ED Notes (Signed)
Bed: [facility] Expected date:  Expected time:  Means of arrival:  Comments: 

## 2018-11-17 NOTE — ED Triage Notes (Signed)
Pt states he has had feeling of hurting himself, but not killing himself. Denies any HI. States he just has been depressed and needs some help.

## 2018-11-17 NOTE — ED Provider Notes (Signed)
[facility]-EMERGENCY DEPT Provider Note   CSN: [id number] Arrival date & time: 11/17/18  0105  Time seen 4:07 AM  History   Chief Complaint Chief Complaint  Patient presents with  . Medical Clearance    HPI Paul Campbell is a 28 y.o. male.     HPI this is the patient's sixth ED visit since February 9.  He had a behavioral health assessment the first visit and he was felt to have suicidal ideations but no plan and was felt to be low risk of self-harm.  Patient states he moved here from [place] 2 months ago.  Patient is extremely angry when asked questions.  He does not understand why he needs to answer any questions.  He states he has been feeling depressed for a few weeks and he feels like he wants to hurt himself when I ask him what he would do he states "I do not know".  He states he has been treated in the past when he wanted to hurt himself and states he used to be on medications however he stopped these medications 1 or 2 years ago and does not recall what they were.  Patient states he moved to [place] 2 months ago from [place] and he is homeless.  When I saw this patient on February 16 he told me he had moved here and was living with a friend however it did not work out and he was kicked out about a week ago.  History reviewed. No pertinent past medical history.  Patient Active Problem List   Diagnosis Date Noted  . Substance induced mood disorder (HCC) 11/09/2018    History reviewed. No pertinent surgical history.      Home Medications    Prior to Admission medications   Medication Sig Start Date End Date Taking? Authorizing Provider  ibuprofen (ADVIL,MOTRIN) 800 MG tablet Take 1 tablet (800 mg total) by mouth every 8 (eight) hours as needed. 11/16/18   Lawyer, Cristal Deerhristopher, PA-C    Family History No family history on file.  Social History Social History   Tobacco Use  . Smoking status: Current Every Day Smoker  . Smokeless  tobacco: Never Used  Substance Use Topics  . Alcohol use: Yes  . Drug use: Yes    Types: Cocaine  Homeless Unemployed   Allergies   Patient has no known allergies.   Review of Systems Review of Systems  All other systems reviewed and are negative.    Physical Exam Updated Vital Signs BP 135/89 (BP Location: Left Arm)   Pulse 64   Temp 97.6 F (36.4 C) (Oral)   Resp 15   Ht 5\' 5"  (1.651 m)   Wt 63.5 kg   SpO2 96%   BMI 23.30 kg/m   Physical Exam Vitals signs and nursing note reviewed.  Constitutional:      Comments: Sleeping in no distress, hard to get awake.  Patient gets very angry that he has to have a physical exam, he does not understand why he needs one  HENT:     Head: Normocephalic and atraumatic.     Right Ear: External ear normal.     Left Ear: External ear normal.     Nose: Nose normal.     Mouth/Throat:     Mouth: Mucous membranes are moist.     Pharynx: No oropharyngeal exudate or posterior oropharyngeal erythema.  Eyes:     Extraocular Movements: Extraocular movements intact.  Conjunctiva/sclera: Conjunctivae normal.     Pupils: Pupils are equal, round, and reactive to light.  Cardiovascular:     Rate and Rhythm: Normal rate and regular rhythm.  Pulmonary:     Effort: Pulmonary effort is normal. No respiratory distress.     Breath sounds: Normal breath sounds.  Musculoskeletal: Normal range of motion.  Skin:    General: Skin is warm and dry.  Neurological:     General: No focal deficit present.     Mental Status: He is oriented to person, place, and time.     Cranial Nerves: No cranial nerve deficit.  Psychiatric:        Mood and Affect: Affect is labile.        Speech: Speech is delayed.        Behavior: Behavior is slowed.        Thought Content: Thought content includes suicidal ideation. Thought content does not include suicidal plan.     Comments: Gets angry very easily, does not feel like he needs to answer any questions or  cooperate for exam.      ED Treatments / Results  Labs (all labs ordered are listed, but only abnormal results are displayed) Results for orders placed or performed during the hospital encounter of 11/17/18  Comprehensive metabolic panel  Result Value Ref Range   Sodium 134 (L) 135 - 145 mmol/L   Potassium 3.7 3.5 - 5.1 mmol/L   Chloride 102 98 - 111 mmol/L   CO2 23 22 - 32 mmol/L   Glucose, Bld 73 70 - 99 mg/dL   BUN 17 6 - 20 mg/dL   Creatinine, Ser 1.61 0.61 - 1.24 mg/dL   Calcium 8.8 (L) 8.9 - 10.3 mg/dL   Total Protein 7.3 6.5 - 8.1 g/dL   Albumin 4.4 3.5 - 5.0 g/dL   AST 35 15 - 41 U/L   ALT 22 0 - 44 U/L   Alkaline Phosphatase 63 38 - 126 U/L   Total Bilirubin 1.2 0.3 - 1.2 mg/dL   GFR calc non Af Amer >60 >60 mL/min   GFR calc Af Amer >60 >60 mL/min   Anion gap 9 5 - 15  Ethanol  Result Value Ref Range   Alcohol, Ethyl (B) <10 <10 mg/dL  Salicylate level  Result Value Ref Range   Salicylate Lvl <7.0 2.8 - 30.0 mg/dL  Acetaminophen level  Result Value Ref Range   Acetaminophen (Tylenol), Serum <10 (L) 10 - 30 ug/mL  cbc  Result Value Ref Range   WBC 8.3 4.0 - 10.5 K/uL   RBC 4.48 4.22 - 5.81 MIL/uL   Hemoglobin 14.3 13.0 - 17.0 g/dL   HCT 09.6 04.5 - 40.9 %   MCV 94.9 80.0 - 100.0 fL   MCH 31.9 26.0 - 34.0 pg   MCHC 33.6 30.0 - 36.0 g/dL   RDW 81.1 91.4 - 78.2 %   Platelets 276 150 - 400 K/uL   nRBC 0.0 0.0 - 0.2 %   Laboratory interpretation all normal     EKG None  Radiology   Procedures Procedures (including critical care time)  Medications Ordered in ED Medications - No data to display   Initial Impression / Assessment and Plan / ED Course  I have reviewed the triage vital signs and the nursing notes.  Pertinent labs & imaging results that were available during my care of the patient were reviewed by me and considered in my medical decision making (see chart for details).  Patient has been seen in the ED on February 9,  February 14, February 16, February 17 and then tonight.  He only complained of being suicidal the first visit.  At this point patient has already had a mental health evaluation in less than a week where he was expressing suicidal ideation without a plan.  This is exactly his presentation tonight.  Patient will be discharged to do the follow-up that he has already been advised to do i.e. [facility].  Final Clinical Impressions(s) / ED Diagnoses   Final diagnoses:  Depression, unspecified depression type  Cocaine abuse [facility])  Homeless    ED Discharge Orders    None      Plan discharge  Devoria Albe, MD, Concha Pyo, MD 11/17/18 [phone]

## 2018-11-17 NOTE — Discharge Instructions (Addendum)
Look at the resource guide to help you find a shelter, get into your mental health counseling, and to get help with your social situation.

## 2018-11-17 NOTE — ED Notes (Signed)
Attempted multiple times to convince patient to let this nurse or tech recheck vitals. Pulse 72, O2 saturation was 99, but during blood pressure check patient ripped off cuff and threw it on the floor. Pt became yelling saying he doesn't need any resources. Yelling and cursing at staff. Security at bedside escorting patient out.

## 2018-11-17 NOTE — ED Notes (Signed)
This writer attempted to get discharge vitals from patient, after patient given papers he snatched the bp cuff off and wouldn't let this writer continue with vitals.

## 2018-11-17 NOTE — ED Notes (Signed)
Pt called for 3rd time for triage. Pt gets into triage room and states he doesn't want to be seen anymore.  Pt informed that he can leave but cant stay out in lobby sleeping.

## 2018-11-19 ENCOUNTER — Emergency Department (HOSPITAL_COMMUNITY)
Admission: EM | Admit: 2018-11-19 | Discharge: 2018-11-19 | Disposition: A | Discharge status: Home / Self Care | Payer: Self-pay | Attending: Emergency Medicine | Admitting: Emergency Medicine

## 2018-11-19 ENCOUNTER — Encounter (HOSPITAL_COMMUNITY): Payer: Self-pay

## 2018-11-19 ENCOUNTER — Other Ambulatory Visit: Payer: Self-pay

## 2018-11-19 DIAGNOSIS — F17200 Nicotine dependence, unspecified, uncomplicated: Secondary | ICD-10-CM | POA: Insufficient documentation

## 2018-11-19 DIAGNOSIS — M79671 Pain in right foot: Secondary | ICD-10-CM | POA: Insufficient documentation

## 2018-11-19 MED ORDER — IBUPROFEN 200 MG PO TABS
400.0000 mg | ORAL_TABLET | Freq: Once | ORAL | Status: AC
  Administered 2018-11-19: 400 mg via ORAL
  Filled 2018-11-19: qty 2

## 2018-11-19 NOTE — Discharge Instructions (Addendum)
You have been diagnosed today with Right Foot Pain.  At this time there does not appear to be the presence of an emergent medical condition, however there is always the potential for conditions to change. Please read and follow the below instructions.  Please return to the Emergency Department immediately for any new or worsening symptoms. Please be sure to follow up with your Primary Care Provider within one week regarding your visit today; please call their office to schedule an appointment even if you are feeling better for a follow-up visit. You have been given referral to [facility] health community health and wellness to establish a primary care provider.  I advised that you call them this week to establish a primary care relationship. Please use rest, ice and elevation to help with your right foot pain. Please drink plenty of water. Supportive shoes and rest will may be helpful. I have attached a resource guide for local shelters that you may stay at.  Please utilize these resources.  Get help right away if: Your foot is numb or tingling. Your foot or toes are swollen. Your foot or toes turn white or blue. You have warmth and redness along your foot. Your pain gets worse. You cannot stand on your foot.  Please read the additional information packets attached to your discharge summary.  RESOURCE GUIDE  Chronic Pain Problems: Contact [facility]  [phone] Patients need to be referred by their primary care doctor.  Insufficient Money for Medicine: Contact United Way:  call "211" or [facility] 782-040-8209.  No Primary Care Doctor: Call [facility] Connect  [phone] - can help you locate a primary care doctor that  accepts your insurance, provides certain services, etc. Physician Referral Service- [phone]  Agencies that provide inexpensive medical care: [facility]  [phone] [facility]  [phone] [facility]  [phone] [facility]  [phone] [facility]  [phone] [facility]  [phone]  Medicaid-accepting [facility] Providers: [facility]- [address]  [phone], Mon-Fri 9am-7pm, Sat 9am-1pm [facility]- [address] [place], [address] [place]  [phone] [facility]- [address] Suite [place]  [phone] [facility]- [address]  [phone] Renaye Rakers- [address] [place], [address], [phone]  Only accepts [place] patients after they have their name  applied to their card  Self Pay (no insurance) in [facility]: Sickle Cell Patients: Dr Willey Blade, [facility]  [address] [place], [phone] [facility]- [address] [place]  [phone]       [facility] [place]- [address]       -     [facility]- see information above (Speak to [employer] if you do not have insurance)       -  [facility]- [address] [place], [phone]       -  [facility]- [address],  [phone]       -  [facility]- [address] [phone]       -  Dr Julio Sicks-  [address], [place], [phone]       -  [facility]- [address], [phone]       -  [facility] [phone]  [address], [phone], also [address], [phone]       -    [facility]- [address] [place], [phone], 1st & 3rd Saturday   every month, 10am-1pm  1) Find a Doctor and Pay Out of Pocket Although you won't have to find out who is covered by your insurance plan, it is a good idea to ask around and get recommendations. You will then need to call the office and see if the doctor you have chosen will accept you as a new patient and what types of options they offer for patients who are self-pay. Some doctors offer discounts or will set up payment plans  for their patients who do not have insurance, but you will need to ask so you aren't surprised when you get to your appointment.  2) Contact Your Local Health Department Not all health departments have doctors that can see patients for sick visits, but many do, so it is worth a call to see if yours does. If you don't know where your local health department is, you can check in your phone book. The CDC also has a tool to help you locate your state's health department, and many state websites also have listings of all of their local health departments.  3) Find a Walk-in Clinic If your illness is not likely to be very severe or complicated, you may want to try a walk in clinic. These are popping up all over the country in pharmacies, drugstores, and shopping centers. They're usually staffed by nurse practitioners or physician assistants that have been trained to treat common illnesses and complaints. They're usually fairly quick and inexpensive. However, if you have serious medical issues or chronic medical problems, these are probably not your best option  [facility] Department of [facility], STD Clinic, [address], [place], phone [phone] or [phone].  Monday - Friday, call for an appointment. [facility] Department of [employer], STD Clinic, [place] [address], [place], phone [phone] or [phone].  Monday - Friday, call for an appointment.  Abuse/Neglect: [facility] Child Abuse Hotline (306)535-8306 [facility] Child Abuse Hotline (270) 503-9681 (After Hours)  Emergency Shelter:  [facility] 340 006 9147  Maternity Homes: Room at the [place] [facility] 419-038-5050 [facility] 747-486-2686  MRSA Hotline #:   (574) 704-1175  [facility] [place] Resources  [facility] [place]  United Way [facility] Dept. [address]                 [address]          [address]  [place]                                               Cristobal Goldmann Phone:  [phone]                                  Phone:  [phone]  Phone:  [place], [place]- [phone]       -     [facility] in [place], [address]                                  [place] [phone] or [phone] (After Hours)   [place]  Substance Abuse Resources: Alcohol and Drug Services  [place] 772-320-3965 [facility] Chinita Pester 864-743-5180 Residential & Outpatient Substance Abuse Program  256-641-8730  Psychological Services: [place]  2563379020 [place]  [place], [place]. [address] [place], [place]: [phone] or [phone], PicCapture[employer]uy  Dental Assistance  If unable to pay or uninsured, contact:  [facility] or [facility] to become qualified for the adult dental clinic.  Patients with Medicaid: [facility] [phone], [place] [address] [phone]  If unable to pay, or uninsured, contact [facility] [phone]) or [place] [phone] in [place], [place] in [facility]) to become qualified for the adult dental clinic   Other Croom- [place], [place], [place], [address], [place], [place], 2nd and 4th Thursday of the month at 6:30am.  10 clients each day by appointment, can sometimes see walk-in patients if someone does not show for an appointment. [facility]- [address] Hillard Danker [place], [place], [address],  [place], [place], [place], [address], [place] Department- [phone] [facility] [place] [phone]

## 2018-11-19 NOTE — ED Notes (Signed)
Discharge instructions reviewed with patient. Patient verbalizes understanding. VSS.  Patient refused to sign for paperwork.

## 2018-11-19 NOTE — ED Provider Notes (Signed)
[facility]-EMERGENCY DEPT Provider Note   CSN: [id number] Arrival date & time: 11/19/18  3358    History   Chief Complaint Chief Complaint  Patient presents with  . Foot Pain    HPI Paul Campbell is a 28 y.o. male presented today for right foot pain.  Patient states that pain has been present for approximately 1 month.  He describes an aching sensation along his fifth MTP, mild intensity and only present when he is walking for long periods of time.  He states that pain is improved with rest and elevation.  Patient denies injury or trauma to the area.  This is patient's third ED visit for the same complaint this month.    Patient with imaging on 11/16/2018  ------------------------------------------- RIGHT FOOT COMPLETE - 3+ VIEW  COMPARISON:  11/08/2018  FINDINGS: There is no evidence of fracture or dislocation. There is no evidence of arthropathy or other focal bone abnormality. Soft tissues are unremarkable.  IMPRESSION: Negative. -------------------------------------------- Patient denies injury, trauma or change in his pain since last ED visit.  Patient denies fever, swelling, wound or any additional concerns today.  Patient reports that he has recently bought new shoes and that his pain has been improving however he is still concerned that after he walks for long periods of time he still has pain.  He reports that he is homeless and is on his feet often.     Foot Pain  This is a recurrent problem. The current episode started more than 1 week ago. The problem occurs daily. The problem has not changed since onset.Pertinent negatives include no chest pain, no abdominal pain and no shortness of breath. The symptoms are aggravated by walking. The symptoms are relieved by rest. He has tried rest for the symptoms. The treatment provided significant relief.    History reviewed. No pertinent past medical history.  Patient Active Problem List   Diagnosis Date Noted  . Substance induced mood disorder (HCC) 11/09/2018    History reviewed. No pertinent surgical history.    Home Medications    Prior to Admission medications   Medication Sig Start Date End Date Taking? Authorizing Provider  ibuprofen (ADVIL,MOTRIN) 800 MG tablet Take 1 tablet (800 mg total) by mouth every 8 (eight) hours as needed. Patient taking differently: Take 800 mg by mouth every 8 (eight) hours as needed for mild pain.  11/16/18   Charlestine Night, PA-C    Family History History reviewed. No pertinent family history.  Social History Social History   Tobacco Use  . Smoking status: Current Every Day Smoker  . Smokeless tobacco: Never Used  Substance Use Topics  . Alcohol use: Yes  . Drug use: Yes    Types: Cocaine     Allergies   Patient has no known allergies.   Review of Systems Review of Systems  Constitutional: Negative.  Negative for chills and fever.  Respiratory: Negative for shortness of breath.   Cardiovascular: Negative for chest pain.  Gastrointestinal: Negative for abdominal pain.  Musculoskeletal: Positive for arthralgias. Negative for back pain and joint swelling.  Skin: Negative.  Negative for color change, rash and wound.  Neurological: Negative.  Negative for weakness and numbness.   Physical Exam Updated Vital Signs BP 120/80 (BP Location: Right Arm)   Pulse 63   Temp 97.7 F (36.5 C) (Oral)   Resp 16   Ht 5\' 5"  (1.651 m)   Wt 63.5 kg   SpO2 99%   BMI 23.30  kg/m   Physical Exam Constitutional:      General: He is not in acute distress.    Appearance: Normal appearance. He is well-developed. He is not ill-appearing or diaphoretic.  HENT:     Head: Normocephalic and atraumatic.     Right Ear: External ear normal.     Left Ear: External ear normal.     Nose: Nose normal.  Eyes:     General: Vision grossly intact. Gaze aligned appropriately.     Pupils: Pupils are equal, round, and reactive to light.    Neck:     Musculoskeletal: Normal range of motion.     Trachea: Trachea and phonation normal. No tracheal deviation.  Cardiovascular:     Rate and Rhythm: Normal rate and regular rhythm.     Pulses: Normal pulses.          Dorsalis pedis pulses are 2+ on the right side and 2+ on the left side.       Posterior tibial pulses are 2+ on the right side and 2+ on the left side.  Pulmonary:     Effort: Pulmonary effort is normal. No respiratory distress.  Abdominal:     General: There is no distension.     Palpations: Abdomen is soft.     Tenderness: There is no abdominal tenderness. There is no guarding or rebound.  Musculoskeletal: Normal range of motion.     Right knee: Normal. He exhibits normal range of motion. No tenderness found.     Right ankle: Normal. No tenderness. Achilles tendon normal.     Right lower leg: Normal. He exhibits no tenderness. No edema.     Right foot: Normal range of motion and normal capillary refill. Tenderness present. No swelling, crepitus or deformity.       Feet:     Comments: Patient with mild tenderness as indicated around right fifth MTP.  No crepitus swelling or deformity present.  No erythema, induration or fluctuance.  No increased warmth.  Pedal pulses intact.  Capillary refill intact.  Sensation intact.  Full range of motion and 5/5 strength to all movements of the knee, ankle, toes/foot.  There is a small callus present to patient's right fourth toe however no signs of infection and no pain.  Patient states that this is not concerning to him today and has been there for some time.  Feet:     Right foot:     Protective Sensation: 3 sites tested. 3 sites sensed.     Left foot:     Protective Sensation: 3 sites tested. 3 sites sensed.  Skin:    General: Skin is warm and dry.  Neurological:     Mental Status: He is alert.     GCS: GCS eye subscore is 4. GCS verbal subscore is 5. GCS motor subscore is 6.     Comments: Speech is clear and goal  oriented, follows commands Major Cranial nerves without deficit, no facial droop Normal strength lower extremities bilaterally including dorsiflexion and plantar flexion. Sensation normal to light touch Moves extremities without ataxia, coordination intact Normal gait  Psychiatric:        Behavior: Behavior normal.    ED Treatments / Results  Labs (all labs ordered are listed, but only abnormal results are displayed) Labs Reviewed - No data to display  EKG None  Radiology No results found.  Procedures Procedures (including critical care time)  Medications Ordered in ED Medications  ibuprofen (ADVIL,MOTRIN) tablet 400 mg (has no  administration in time range)     Initial Impression / Assessment and Plan / ED Course  I have reviewed the triage vital signs and the nursing notes.  Pertinent labs & imaging results that were available during my care of the patient were reviewed by me and considered in my medical decision making (see chart for details).    28 year old male presenting today for right foot pain x1 month.  No history of injury or trauma to the area.  Patient with imaging of the right foot on 11/16/2018 which was negative.  Physical examination today is reassuring.  No history of fever, wound or infectious symptoms. Extremity is neurovascularly intact. No signs of septic arthritis, cellulitis, DVT or compartment syndrome.  This does not appear to be gout.  Patient with full range of motion and 5/5 strength with all movements of the knee, foot and ankle.  Suspect patient's pain musculoskeletal in nature possibly due to excessive ambulation.  Patient states that his pain has subsided with rest and elevation here in the emergency department.  He is requesting resources to local homeless shelters which have been provided.  Patient has been given 400 mg ibuprofen in emergency department today as well.  He denies history of CKD or stomach ulcers.  Patient is ambulatory here in the  emergency department without difficulty or assistance.  As there is no history of injury/trauma or change to his pain since previous imaging do not believe that repeat x-ray is indicated at this time.  I have discussed conservative management including rest and elevation.  Patient has been given referral for PCP.  At discharge patient afebrile, not tachycardic, not hypotensive he is well-appearing and in no acute distress.  Patient has been discharged in good condition.  At this time there does not appear to be any evidence of an acute emergency medical condition and the patient appears stable for discharge with appropriate outpatient follow up. Diagnosis was discussed with patient who verbalizes understanding of care plan and is agreeable to discharge. I have discussed return precautions with patient who verbalizes understanding of return precautions. Patient strongly encouraged to follow-up with their PCP within one week. All questions answered.  Note: Portions of this report may have been transcribed using voice recognition software. Every effort was made to ensure accuracy; however, inadvertent computerized transcription errors may still be present. Final Clinical Impressions(s) / ED Diagnoses   Final diagnoses:  Foot pain, right    ED Discharge Orders    None       Elizabeth PalauMorelli, Marcellas Marchant A, PA-C 11/19/18 [id number]    Benjiman CorePickering, Nathan, MD 11/19/18 [phone]

## 2018-11-19 NOTE — ED Triage Notes (Signed)
Patient presents with right foot pain x1 month. Patient denies injury. Patient also seen at [facility] on 11/16/18 and 11/08/18 for the same complaint.

## 2018-11-20 ENCOUNTER — Other Ambulatory Visit: Payer: Self-pay

## 2018-11-20 ENCOUNTER — Encounter (HOSPITAL_COMMUNITY): Payer: Self-pay

## 2018-11-20 ENCOUNTER — Emergency Department (HOSPITAL_COMMUNITY)
Admission: EM | Admit: 2018-11-20 | Discharge: 2018-11-20 | Disposition: A | Discharge status: Home / Self Care | Payer: Self-pay | Attending: Emergency Medicine | Admitting: Emergency Medicine

## 2018-11-20 DIAGNOSIS — Z5900 Homelessness unspecified: Secondary | ICD-10-CM

## 2018-11-20 DIAGNOSIS — L84 Corns and callosities: Secondary | ICD-10-CM | POA: Insufficient documentation

## 2018-11-20 DIAGNOSIS — F17210 Nicotine dependence, cigarettes, uncomplicated: Secondary | ICD-10-CM | POA: Insufficient documentation

## 2018-11-20 MED ORDER — BACITRACIN ZINC 500 UNIT/GM EX OINT
TOPICAL_OINTMENT | Freq: Two times a day (BID) | CUTANEOUS | Status: DC
  Administered 2018-11-20: 1 via TOPICAL
  Filled 2018-11-20: qty 0.9

## 2018-11-20 NOTE — ED Notes (Signed)
Bed: DP94 Expected date:  Expected time:  Means of arrival:  Comments: 17M Worsening toe pain

## 2018-11-20 NOTE — ED Notes (Signed)
Placed patient's right foot in warm water per PA.

## 2018-11-20 NOTE — ED Triage Notes (Signed)
Per EMS, patient coming from the side of the rode where he saw PD parked and flagged them down so that he could be evaluated for his toe.  He was seen previously for the same issue.

## 2018-11-20 NOTE — Discharge Instructions (Addendum)
Thank you for allowing me to care for you today in the Emergency Department.   Apply gauze between the toes to help keep them from rubbing together.  Corn pads are available over the counter at stores such as [employer] or [facility]. Use as directed.   Follow up with Chales Abrahams Placey at the [facility] if you pain continues.  Use the resources provided by social work to help with housing.  Return to the emergency department if you develop thick, mucus-like drainage from the foot or toes, fever chills, or if the feet become red, hot to the touch, or swollen.

## 2018-11-20 NOTE — ED Provider Notes (Signed)
Care assumed from PA Mia McDonald at shift change, please see her note for full details, but in brief Paul Campbell is a 28 y.o. male who is homeless and presents for evaluation of toe pain.  Prior provider was able to pare down the patient's corns over the fourth and fifth toes and applied a padded dressing and bacitracin.  At shift change pending social work consult for resources.  Plan: Awaiting social work consult  MDM  Hortencia Pilar LCSW with social work has faxed over a list of housing resources and shelters for the patient, and has also been referred to [facility] at the [facility] for follow-up.  I have discussed return precautions with the patient.  He is discharged in good condition.   Dartha Lodge, [place] 11/20/18 2426    Nicanor Alcon April, MD 11/20/18 2316

## 2018-11-20 NOTE — ED Provider Notes (Addendum)
[facility]-EMERGENCY DEPT Provider Note   CSN: [id number] Arrival date & time: 11/20/18  2924    History   Chief Complaint Chief Complaint  Patient presents with  . Toe Pain    HPI Paul Campbell is a 28 y.o. male with a history of substance-induced mood disorder presents to the emergency department with a chief complaint of right toe pain.  The patient endorses constant, worsening pain to the toes of the right foot.  Pain is maximal to the fifth toe.  Pain worsens with walking long distances.  Pain is improved with rest.  No fever, chills, numbness, weakness, falls, or injuries.  The patient was evaluated formulated for the same within the last 24 hours.  Patient suffers from homelessness.  He recently moved here from [place] and was living with a friend, but he was kicked out about a week ago.  He is unfamiliar with resources within the community.  He reports the other reason he came to the ER tonight is because he is hungry and cold.     The history is provided by the patient. No language interpreter was used.    History reviewed. No pertinent past medical history.  Patient Active Problem List   Diagnosis Date Noted  . Substance induced mood disorder (HCC) 11/09/2018    History reviewed. No pertinent surgical history.      Home Medications    Prior to Admission medications   Medication Sig Start Date End Date Taking? Authorizing Provider  ibuprofen (ADVIL,MOTRIN) 800 MG tablet Take 1 tablet (800 mg total) by mouth every 8 (eight) hours as needed. Patient taking differently: Take 800 mg by mouth every 8 (eight) hours as needed for mild pain.  11/16/18   Charlestine Night, PA-C    Family History History reviewed. No pertinent family history.  Social History Social History   Tobacco Use  . Smoking status: Current Every Day Smoker  . Smokeless tobacco: Never Used  Substance Use Topics  . Alcohol use: Yes  . Drug use: Yes    Types:  Cocaine     Allergies   Patient has no known allergies.   Review of Systems Review of Systems  Constitutional: Negative for activity change, chills and fever.  Respiratory: Negative for shortness of breath.   Cardiovascular: Negative for chest pain.  Gastrointestinal: Negative for abdominal pain.  Musculoskeletal: Positive for arthralgias and myalgias. Negative for back pain, gait problem, joint swelling, neck pain and neck stiffness.  Skin: Negative for rash.  Neurological: Negative for weakness and numbness.     Physical Exam Updated Vital Signs BP 136/62 (BP Location: Left Arm)   Pulse 78   Temp (!) 97.5 F (36.4 C) (Oral)   Resp 18   Ht 5\' 6"  (1.676 m)   Wt 63 kg   SpO2 97%   BMI 22.44 kg/m   Physical Exam Vitals signs and nursing note reviewed.  Constitutional:      Appearance: He is well-developed.  HENT:     Head: Normocephalic.  Eyes:     Conjunctiva/sclera: Conjunctivae normal.  Neck:     Musculoskeletal: Neck supple.  Cardiovascular:     Rate and Rhythm: Normal rate and regular rhythm.     Heart sounds: No murmur.  Pulmonary:     Effort: Pulmonary effort is normal.  Abdominal:     General: There is no distension.     Palpations: Abdomen is soft.  Musculoskeletal:     Comments: Large corn  to the lateral aspect of the right fourth toe. Callus noted to the medial aspect of the right fifth toes and to the plantar surface of the fourth and fifth toes.  Neurovascularly intact.  Good capillary refill of the digits of the right toes.  Full active and passive range of motion of the ankle and all digits of the right toe.  No overlying erythema, edema, or warmth.  Skin:    General: Skin is warm and dry.  Neurological:     Mental Status: He is alert.  Psychiatric:        Behavior: Behavior normal.      ED Treatments / Results  Labs (all labs ordered are listed, but only abnormal results are displayed) Labs Reviewed - No data to  display  EKG None  Radiology No results found.  Procedures Procedures (including critical care time)  Medications Ordered in ED Medications - No data to display   Initial Impression / Assessment and Plan / ED Course  I have reviewed the triage vital signs and the nursing notes.  Pertinent labs & imaging results that were available during my care of the patient were reviewed by me and considered in my medical decision making (see chart for details).  Clinical Course as of Nov 24 2214  Fri Nov 20, 2018  0701 SW   [MB]    Clinical Course User Index [MB] Sabas Sous, MD       28 year old male with history of substance-induced mood disorder presenting with right toe pain the last month.  On exam, he has multiple corns and calluses to the fourth and fifth toes on the right foot. CPT 11055 "Paring or cutting of benign hyperkeratotic lesion". 10 blade scalpel used to pare down corn of the fourth and the fifth toes.  Gauze dressing with bacitracin applied since corn pads were not available in the ER.  He reports the pain is much improved.  Patient also reports he is suffering from homelessness as he recently relocated from [place].  He is unfamiliar with homelessness resources in the area.  Will consult social work.  Patient care transferred to PA Ford at the end of my shift pending social work consult. Patient presentation, ED course, and plan of care discussed with review of all pertinent labs and imaging. Please see his/her note for further details regarding further ED course and disposition.  Final Clinical Impressions(s) / ED Diagnoses   Final diagnoses:  Corns of multiple toes  Homelessness    ED Discharge Orders    None       Barkley Boards, PA-C 11/20/18 9417    Palumbo, April, MD 11/20/18 4081    Barkley Boards, PA-C 11/24/18 2216    Palumbo, April, MD 11/27/18 2311

## 2018-11-20 NOTE — Progress Notes (Signed)
CSW spoke with MD Ala Dach and was informed that pt is in need of resources. CSW has faxed over shelter list and other housing resources in the [facility] area. At this time there appears to be no further CSW needs. CSW sill sign off.      Claude Manges. Peytan Andringa, MSW, LCSW-A Emergency Department Clinical Social Worker [phone]

## 2020-08-24 IMAGING — CR DG FOOT COMPLETE 3+V*R*
3 series · 3 of 3 positions shown · non-contrast
Comparison: None.

CLINICAL DATA: Fifth toe discoloration

EXAM:
RIGHT FOOT COMPLETE - 3+ VIEW

[t foot ap right]
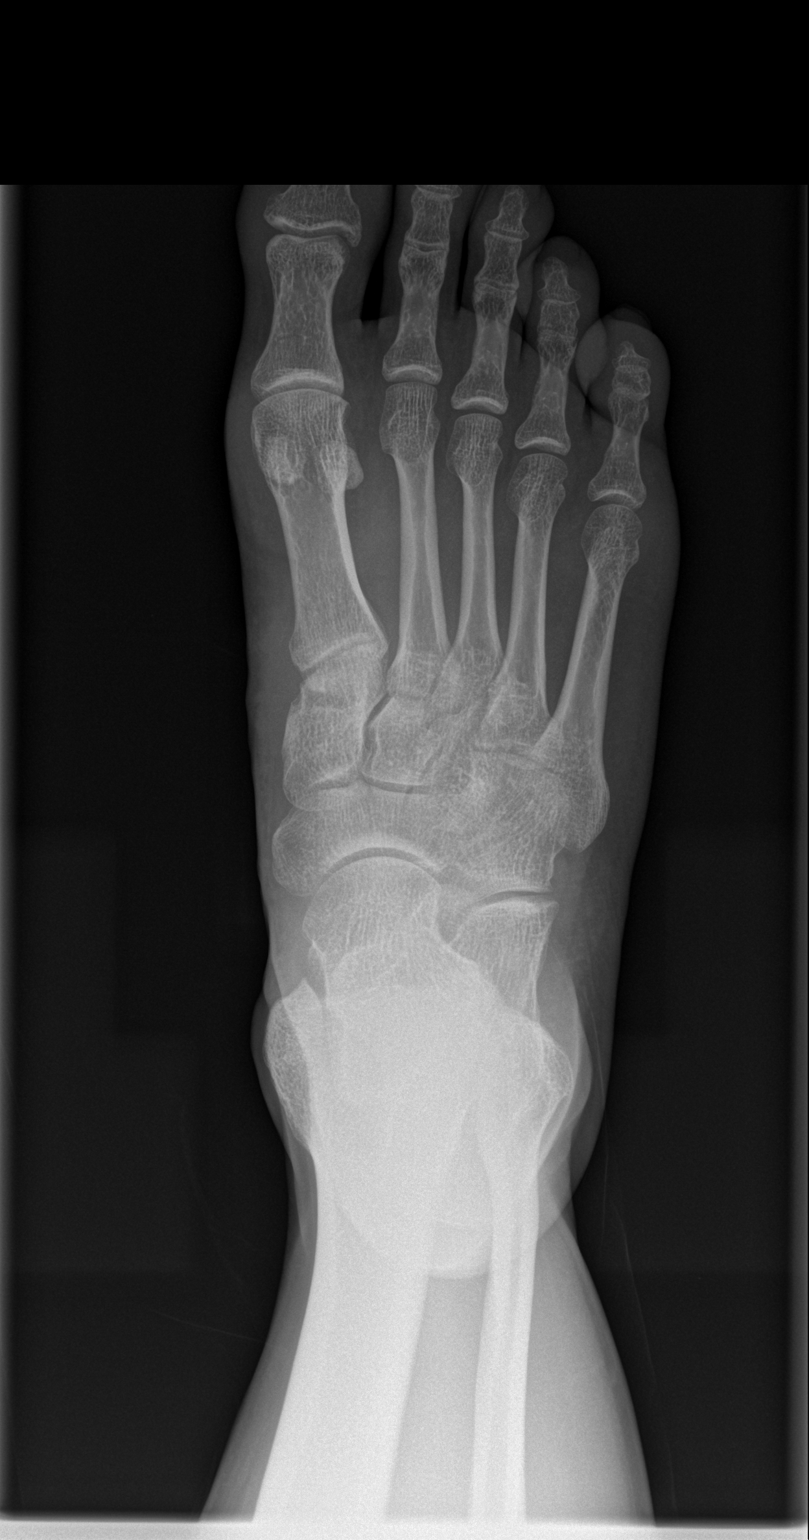

[t foot obl right]
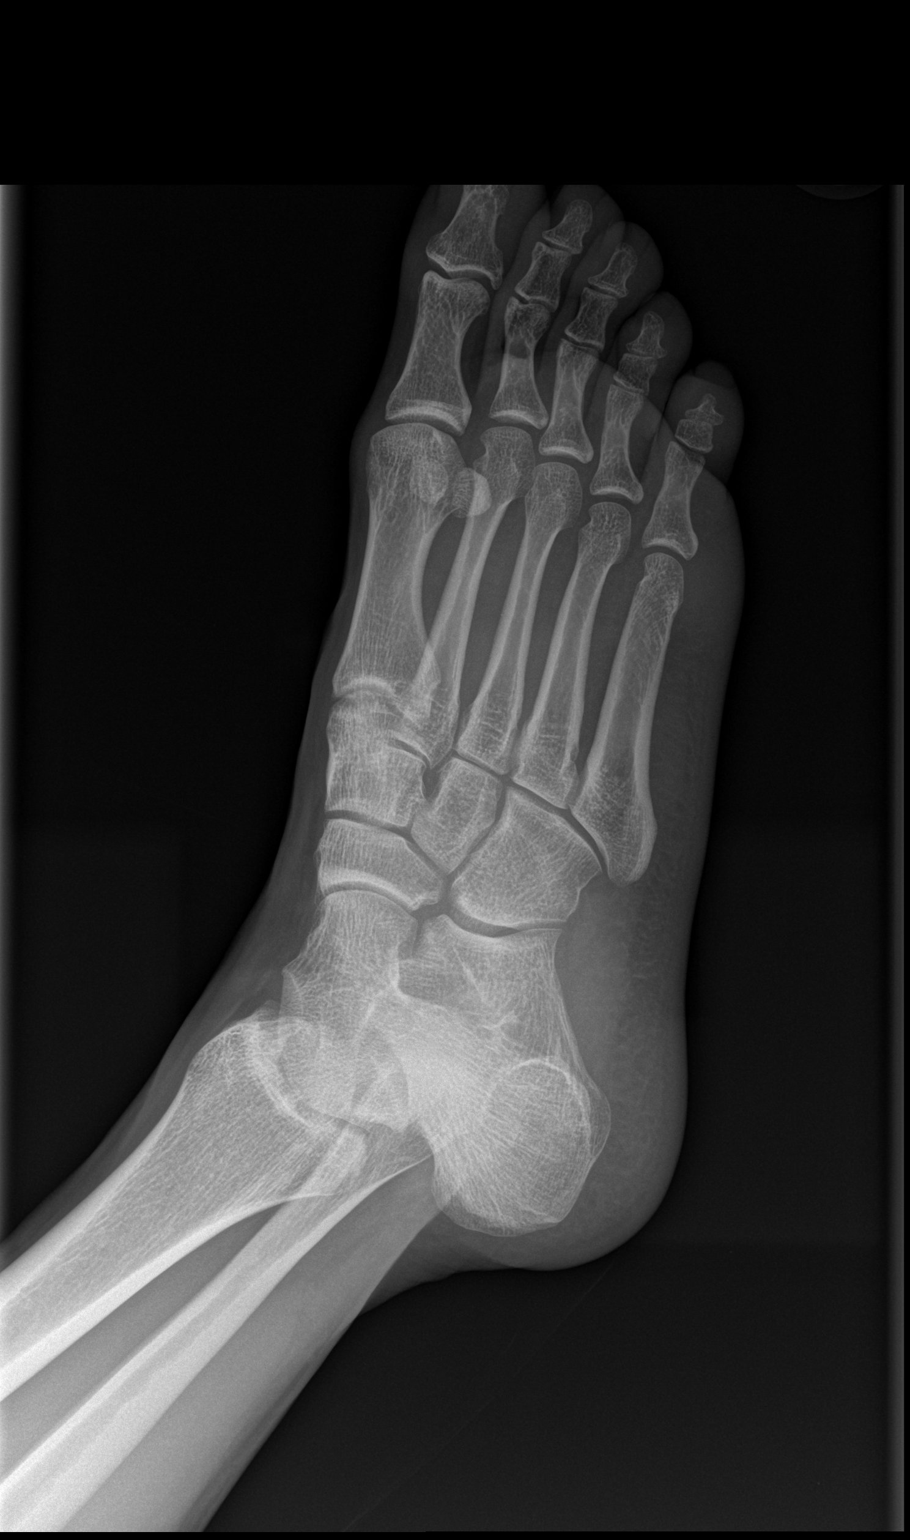

[t foot lat right]
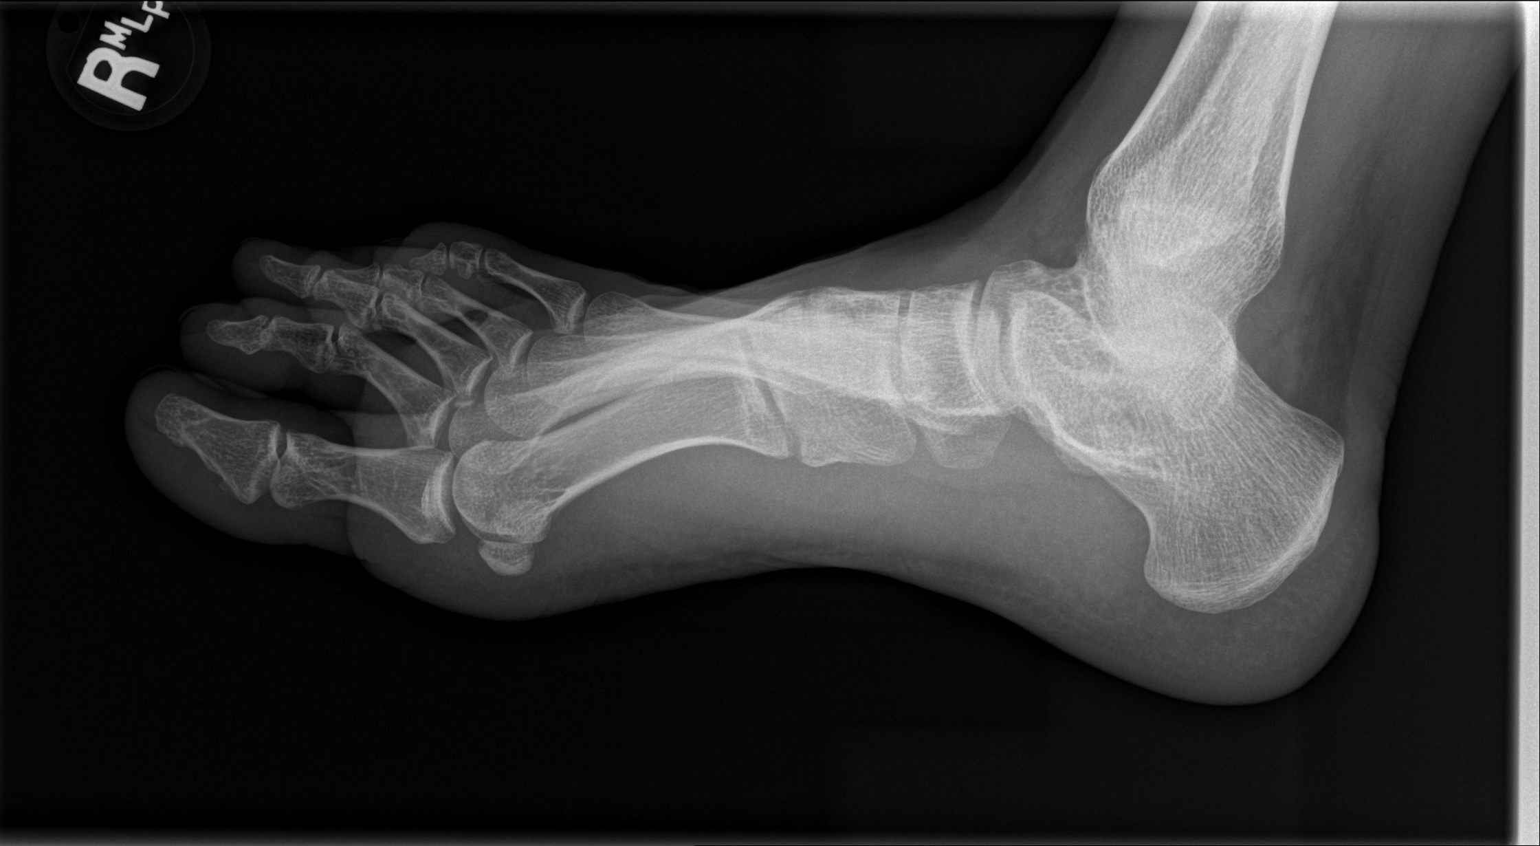

[3 of 3 positions shown; findings below may reference images not displayed]

FINDINGS: Mild soft tissue swelling of the fifth toe is noted. No acute
fracture or dislocation is seen. No other soft tissue abnormality is
seen.
IMPRESSION: Mild soft tissue swelling in the fifth toe. No acute bony
abnormality is seen.

## 2020-09-01 IMAGING — CR DG FOOT COMPLETE 3+V*R*
3 series · 3 of 3 positions shown · non-contrast
Comparison: 11/08/2018

CLINICAL DATA: Lateral foot pain for 1 month.  No injury.

EXAM:
RIGHT FOOT COMPLETE - 3+ VIEW

[x foot ap right]
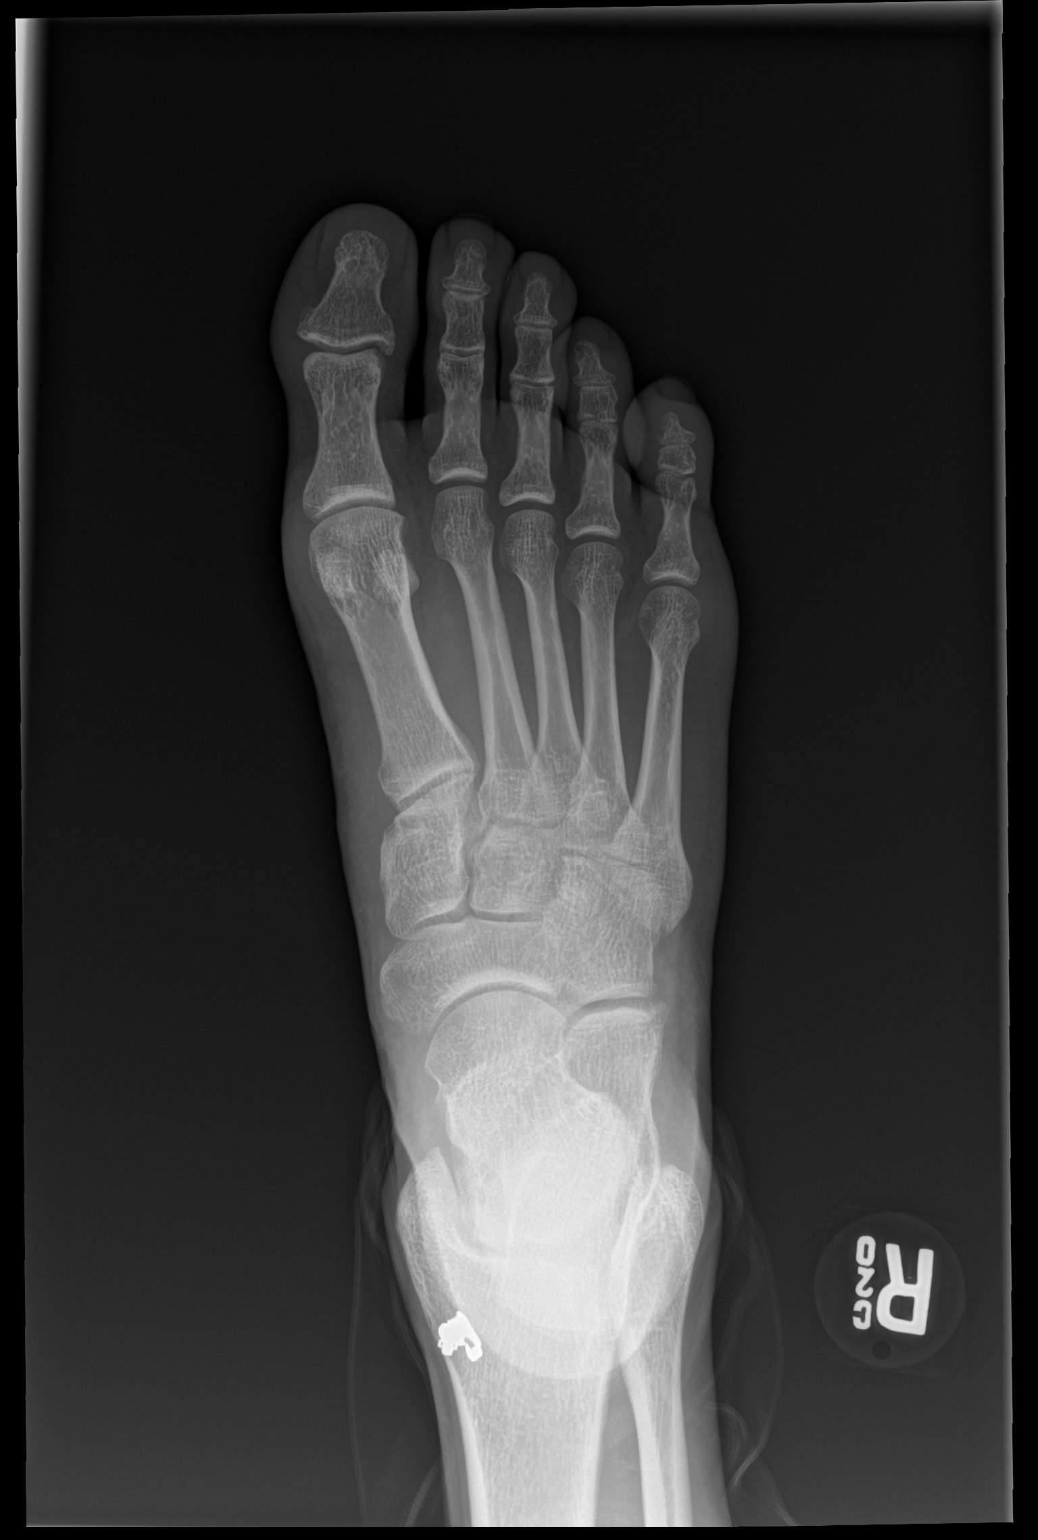

[x foot obl right]
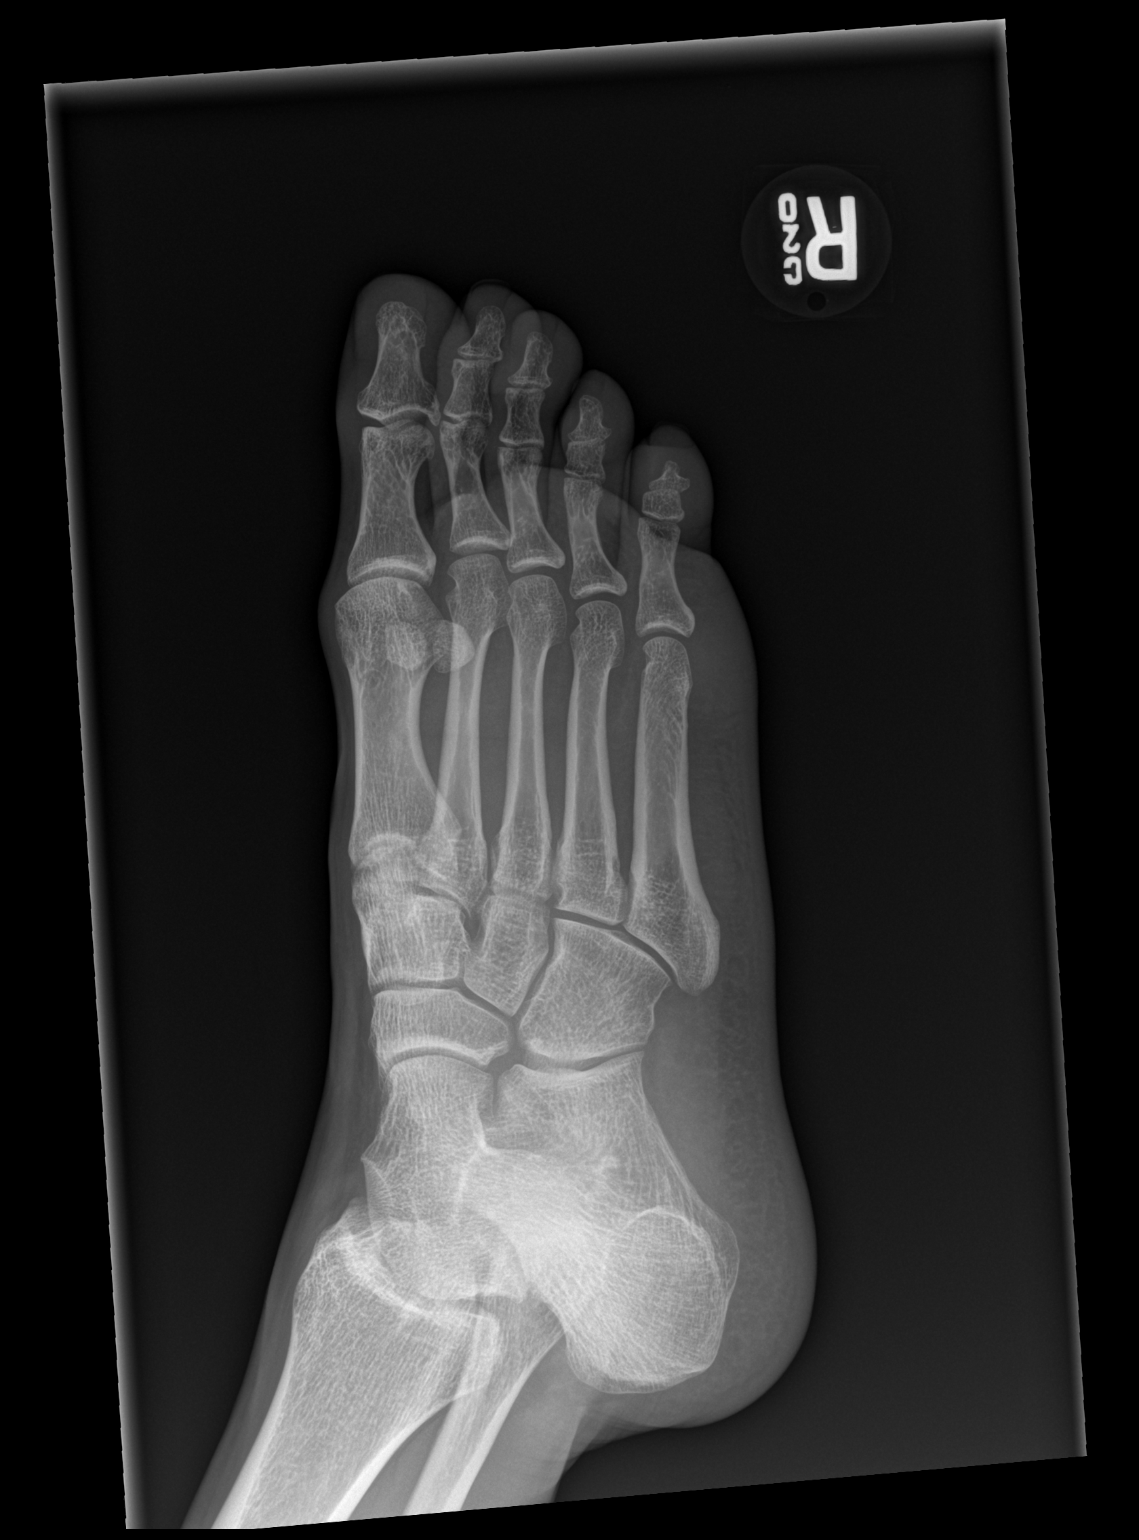

[x foot lat right]
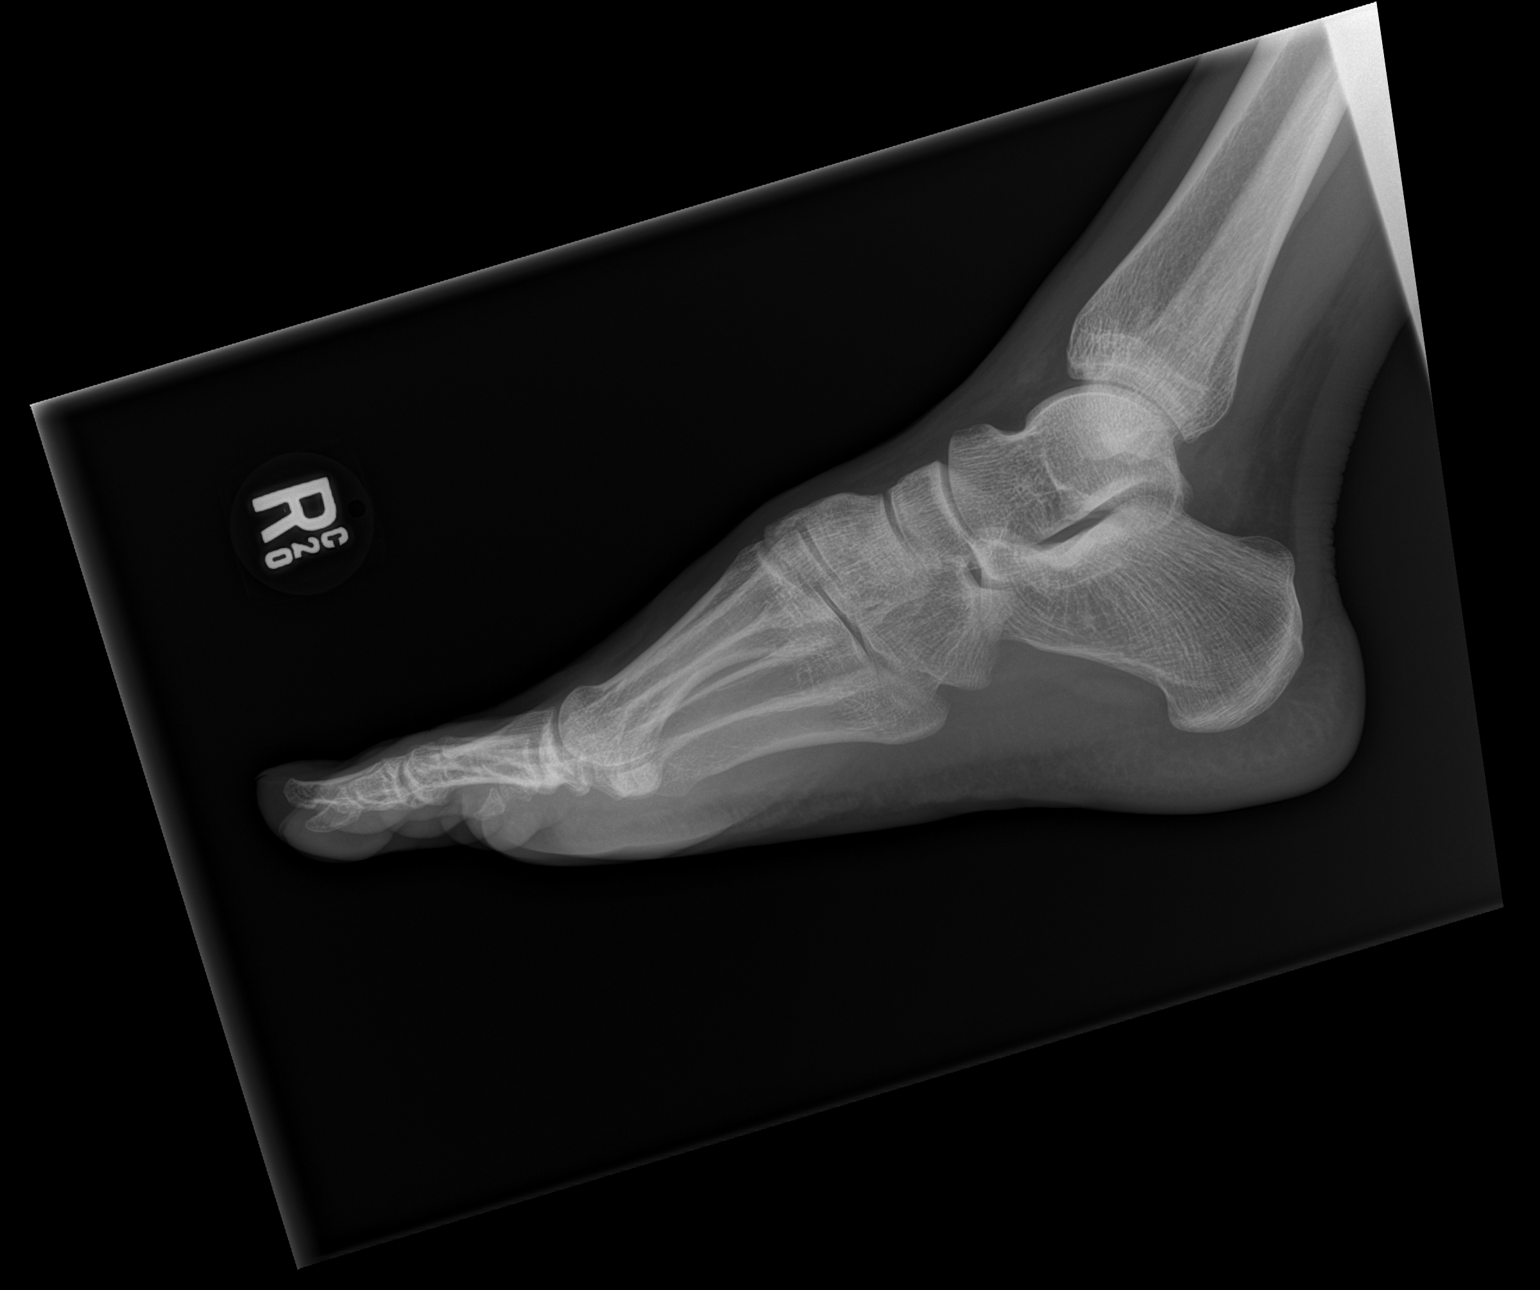

[3 of 3 positions shown; findings below may reference images not displayed]

FINDINGS: There is no evidence of fracture or dislocation. There is no
evidence of arthropathy or other focal bone abnormality. Soft
tissues are unremarkable.
IMPRESSION: Negative.

## 2021-04-29 ENCOUNTER — Emergency Department (HOSPITAL_COMMUNITY): Payer: Self-pay

## 2021-04-29 ENCOUNTER — Emergency Department (HOSPITAL_COMMUNITY)
Admission: EM | Admit: 2021-04-29 | Discharge: 2021-04-29 | Disposition: A | Discharge status: Home / Self Care | Payer: Self-pay | Attending: Emergency Medicine | Admitting: Emergency Medicine

## 2021-04-29 DIAGNOSIS — R404 Transient alteration of awareness: Secondary | ICD-10-CM

## 2021-04-29 DIAGNOSIS — R456 Violent behavior: Secondary | ICD-10-CM | POA: Insufficient documentation

## 2021-04-29 DIAGNOSIS — F17200 Nicotine dependence, unspecified, uncomplicated: Secondary | ICD-10-CM | POA: Insufficient documentation

## 2021-04-29 DIAGNOSIS — Z79899 Other long term (current) drug therapy: Secondary | ICD-10-CM | POA: Insufficient documentation

## 2021-04-29 DIAGNOSIS — Y900 Blood alcohol level of less than 20 mg/100 ml: Secondary | ICD-10-CM | POA: Insufficient documentation

## 2021-04-29 DIAGNOSIS — I639 Cerebral infarction, unspecified: Secondary | ICD-10-CM | POA: Insufficient documentation

## 2021-04-29 LAB — BLOOD GAS, VENOUS
Acid-Base Excess: 1.4 mmol/L (ref 0.0–2.0)
Bicarbonate: 28 mmol/L (ref 20.0–28.0)
FIO2: 21
O2 Saturation: 64.4 %
Patient temperature: 98.6
pCO2, Ven: 55.3 mmHg (ref 44.0–60.0)
pH, Ven: 7.325 (ref 7.250–7.430)
pO2, Ven: 40.5 mmHg (ref 32.0–45.0)

## 2021-04-29 LAB — CBC WITH DIFFERENTIAL/PLATELET
Abs Immature Granulocytes: 0.02 10*3/uL (ref 0.00–0.07)
Basophils Absolute: 0 10*3/uL (ref 0.0–0.1)
Basophils Relative: 0 %
Eosinophils Absolute: 0.1 10*3/uL (ref 0.0–0.5)
Eosinophils Relative: 2 %
HCT: 38 % — ABNORMAL LOW (ref 39.0–52.0)
Hemoglobin: 12.7 g/dL — ABNORMAL LOW (ref 13.0–17.0)
Immature Granulocytes: 0 %
Lymphocytes Relative: 35 %
Lymphs Abs: 2.2 10*3/uL (ref 0.7–4.0)
MCH: 31 pg (ref 26.0–34.0)
MCHC: 33.4 g/dL (ref 30.0–36.0)
MCV: 92.7 fL (ref 80.0–100.0)
Monocytes Absolute: 0.6 10*3/uL (ref 0.1–1.0)
Monocytes Relative: 10 %
Neutro Abs: 3.2 10*3/uL (ref 1.7–7.7)
Neutrophils Relative %: 53 %
Platelets: 261 10*3/uL (ref 150–400)
RBC: 4.1 MIL/uL — ABNORMAL LOW (ref 4.22–5.81)
RDW: 14.5 % (ref 11.5–15.5)
WBC: 6.1 10*3/uL (ref 4.0–10.5)
nRBC: 0 % (ref 0.0–0.2)

## 2021-04-29 LAB — COMPREHENSIVE METABOLIC PANEL
ALT: 18 U/L (ref 0–44)
AST: 30 U/L (ref 15–41)
Albumin: 4.4 g/dL (ref 3.5–5.0)
Alkaline Phosphatase: 77 U/L (ref 38–126)
Anion gap: 8 (ref 5–15)
BUN: 14 mg/dL (ref 6–20)
CO2: 27 mmol/L (ref 22–32)
Calcium: 8.8 mg/dL — ABNORMAL LOW (ref 8.9–10.3)
Chloride: 105 mmol/L (ref 98–111)
Creatinine, Ser: 1 mg/dL (ref 0.61–1.24)
GFR, Estimated: 51 mL/min — ABNORMAL LOW (ref >60)
Glucose, Bld: 99 mg/dL (ref 70–99)
Potassium: 3.3 mmol/L — ABNORMAL LOW (ref 3.5–5.1)
Sodium: 140 mmol/L (ref 135–145)
Total Bilirubin: 1.1 mg/dL (ref 0.3–1.2)
Total Protein: 7.5 g/dL (ref 6.5–8.1)

## 2021-04-29 LAB — URINALYSIS, ROUTINE W REFLEX MICROSCOPIC
Bilirubin Urine: NEGATIVE
Glucose, UA: NEGATIVE mg/dL
Hgb urine dipstick: NEGATIVE
Ketones, ur: 20 mg/dL — AB
Leukocytes,Ua: NEGATIVE
Nitrite: NEGATIVE
Protein, ur: 30 mg/dL — AB
Specific Gravity, Urine: 1.029 (ref 1.005–1.030)
pH: 6 (ref 5.0–8.0)

## 2021-04-29 LAB — I-STAT CHEM 8, ED
BUN: 12 mg/dL (ref 6–20)
Calcium, Ion: 1.19 mmol/L (ref 1.15–1.40)
Chloride: 106 mmol/L (ref 98–111)
Creatinine, Ser: 0.9 mg/dL (ref 0.61–1.24)
Glucose, Bld: 98 mg/dL (ref 70–99)
HCT: 38 % — ABNORMAL LOW (ref 39.0–52.0)
Hemoglobin: 12.9 g/dL — ABNORMAL LOW (ref 13.0–17.0)
Potassium: 3.3 mmol/L — ABNORMAL LOW (ref 3.5–5.1)
Sodium: 144 mmol/L (ref 135–145)
TCO2: 28 mmol/L (ref 22–32)

## 2021-04-29 LAB — RAPID URINE DRUG SCREEN, HOSP PERFORMED
Amphetamines: NOT DETECTED
Barbiturates: NOT DETECTED
Benzodiazepines: NOT DETECTED
Cocaine: POSITIVE — AB
Opiates: NOT DETECTED
Tetrahydrocannabinol: POSITIVE — AB

## 2021-04-29 LAB — LIPASE, BLOOD: Lipase: 32 U/L (ref 11–51)

## 2021-04-29 LAB — LACTIC ACID, PLASMA: Lactic Acid, Venous: 1.3 mmol/L (ref 0.5–1.9)

## 2021-04-29 LAB — PROTIME-INR
INR: 1.1 (ref 0.8–1.2)
Prothrombin Time: 13.8 seconds (ref 11.4–15.2)

## 2021-04-29 LAB — ETHANOL: Alcohol, Ethyl (B): <10 mg/dL (ref <10)

## 2021-04-29 LAB — APTT: aPTT: 27 seconds (ref 24–36)

## 2021-04-29 LAB — CK: Total CK: 770 U/L — ABNORMAL HIGH (ref 49–397)

## 2021-04-29 MED ORDER — SODIUM CHLORIDE 0.9 % IV BOLUS
1000.0000 mL | Freq: Once | INTRAVENOUS | Status: AC
  Administered 2021-04-29: 1000 mL via INTRAVENOUS

## 2021-04-29 NOTE — Discharge Instructions (Addendum)
You were seen in the ER for your altered mental status.  Your work-up was normal.  Suspect that your episode was secondary to your drug use yesterday.  Would encourage you to stop using drugs.  Return to the ER for develop any new severe symptoms.

## 2021-04-29 NOTE — ED Triage Notes (Signed)
Pt BIB [facility] from parking lot of [facility], called out by bystander who had seen him there since around 3am. Pt somewhat alert, responsive to voice and touch. Ambulated to stretcher. Pt alternately twitching and calm.   BP 132/80 HR 64 SpO2 94% RA Temp 96 tympanic

## 2021-04-29 NOTE — ED Triage Notes (Signed)
Pt c/o lower back pain. Tender to palpation.

## 2021-04-29 NOTE — ED Notes (Signed)
Patient sleeping with blanket over head, vss, no needs expressed at this time.

## 2021-04-29 NOTE — ED Provider Notes (Signed)
[facility]-EMERGENCY DEPT Provider Note   CSN: [id number] Arrival date & time: 04/29/21  0725     History Chief Complaint  Patient presents with   Altered Mental Status    [name] is a 30 y.o. male.  This patient is a male appearing to be in his 30s who presents via EMS.  Per EMS, bystander called 911 immediately prior to the patient's transport to the emergency department as this individual had seen the patient lying in the parking lot [name] around 3:00 this morning and saw that he was still there several hours later.  Per EMS patient was responsive to voice and touch and ambulated to the stretcher on his own, however only articulates that his name is [name], unable to answer any other questions.  Level 5 caveat due to patient's altered mental status upon arrival.  No identification was found on the patient's person.  He is wearing pants and one sock but does not have any shirt.  Vital signs normal per EMS.  HPI     No past medical history on file.  Patient Active Problem List   Diagnosis Date Noted   Substance induced mood disorder (HCC) 11/09/2018         No family history on file.  Social History   Tobacco Use   Smoking status: Every Day   Smokeless tobacco: Never  Substance Use Topics   Alcohol use: Yes   Drug use: Yes    Types: Cocaine    Home Medications Prior to Admission medications   Medication Sig Start Date End Date Taking? Authorizing Provider  ibuprofen (ADVIL,MOTRIN) 800 MG tablet Take 1 tablet (800 mg total) by mouth every 8 (eight) hours as needed. Patient taking differently: Take 800 mg by mouth every 8 (eight) hours as needed for mild pain.  11/16/18   Charlestine Night, PA-C    Allergies    Patient has no known allergies.  Review of Systems   Review of Systems  Unable to perform ROS: Mental status change   Physical Exam Updated Vital Signs BP 123/64 (BP Location: Right Arm)   Pulse 75   Temp  97.9 F (36.6 C) (Oral)   Resp 18   SpO2 97%   Physical Exam Vitals and nursing note reviewed.  Constitutional:      Appearance: He is normal weight. He is not toxic-appearing.     Comments: Sleeping but intermittently twitching, intermittently becomes agitated for a brief moment, appears scared.   HENT:     Head: Normocephalic and atraumatic.     Nose: Nose normal. No congestion.     Mouth/Throat:     Mouth: Mucous membranes are moist.     Pharynx: Oropharynx is clear. No oropharyngeal exudate or posterior oropharyngeal erythema.     Tonsils: No tonsillar exudate.  Eyes:     General: Lids are normal.        Right eye: No discharge.        Left eye: No discharge.     Conjunctiva/sclera:     Right eye: Right conjunctiva is injected.     Left eye: Left conjunctiva is injected.     Pupils: Pupils are equal, round, and reactive to light.     Comments: Unable to evaluate EOMs due to patient's inability to follow commands.  Neck:     Trachea: Trachea and phonation normal.  Cardiovascular:     Rate and Rhythm: Normal rate and regular rhythm.  Pulses: Normal pulses.     Heart sounds: Normal heart sounds. No murmur heard. Pulmonary:     Effort: Pulmonary effort is normal. No tachypnea, bradypnea, accessory muscle usage, prolonged expiration or respiratory distress.     Breath sounds: Normal breath sounds. No wheezing or rales.  Chest:     Chest wall: No mass, lacerations, deformity, swelling, tenderness, crepitus or edema.  Abdominal:     General: Bowel sounds are normal. There is no distension.     Palpations: Abdomen is soft.     Tenderness: There is no abdominal tenderness. There is no right CVA tenderness, left CVA tenderness, guarding or rebound.  Musculoskeletal:        General: No deformity.     Cervical back: Normal range of motion and neck supple. No edema, rigidity or crepitus. No pain with movement, spinous process tenderness or muscular tenderness.     Right lower  leg: No edema.     Left lower leg: No edema.  Lymphadenopathy:     Cervical: No cervical adenopathy.  Skin:    General: Skin is warm and dry.     Capillary Refill: Capillary refill takes less than 2 seconds.     Findings: No abrasion, bruising, signs of injury or laceration.  Neurological:     General: No focal deficit present.     GCS: GCS eye subscore is 2. GCS verbal subscore is 2. GCS motor subscore is 4.     Motor: Motor function is intact.  Psychiatric:        Speech: He is noncommunicative.        Behavior: Behavior is aggressive.     Comments: Appears to be responding to internal stimuli.    ED Results / Procedures / Treatments   Labs (all labs ordered are listed, but only abnormal results are displayed) Labs Reviewed  COMPREHENSIVE METABOLIC PANEL - Abnormal; Notable for the following components:      Result Value   Potassium 3.3 (*)    Calcium 8.8 (*)    GFR, Estimated 51 (*)    All other components within normal limits  CBC WITH DIFFERENTIAL/PLATELET - Abnormal; Notable for the following components:   RBC 4.10 (*)    Hemoglobin 12.7 (*)    HCT 38.0 (*)    All other components within normal limits  URINALYSIS, ROUTINE W REFLEX MICROSCOPIC - Abnormal; Notable for the following components:   Color, Urine AMBER (*)    APPearance HAZY (*)    Ketones, ur 20 (*)    Protein, ur 30 (*)    Bacteria, UA RARE (*)    All other components within normal limits  CK - Abnormal; Notable for the following components:   Total CK 770 (*)    All other components within normal limits  RAPID URINE DRUG SCREEN, HOSP PERFORMED - Abnormal; Notable for the following components:   Cocaine POSITIVE (*)    Tetrahydrocannabinol POSITIVE (*)    All other components within normal limits  I-STAT CHEM 8, ED - Abnormal; Notable for the following components:   Potassium 3.3 (*)    Hemoglobin 12.9 (*)    HCT 38.0 (*)    All other components within normal limits  LACTIC ACID, PLASMA   PROTIME-INR  APTT  ETHANOL  LIPASE, BLOOD  BLOOD GAS, VENOUS    EKG EKG: normal EKG, normal sinus rhythm, no STEMI.  Radiology CT Head Wo Contrast  Result Date: 04/29/2021 CLINICAL DATA:  Male of unknown age with altered  mental status, delirium. EXAM: CT HEAD WITHOUT CONTRAST TECHNIQUE: Contiguous axial images were obtained from the base of the skull through the vertex without intravenous contrast. COMPARISON:  None. FINDINGS: Brain: Normal cerebral volume. No midline shift, ventriculomegaly, mass effect, evidence of mass lesion, intracranial hemorrhage or evidence of cortically based acute infarction. Gray-white matter differentiation is within normal limits throughout the brain. Vascular: Distal left vertebral artery appears dominant. No suspicious intracranial vascular hyperdensity. Skull: Intact, negative. Sinuses/Orbits: Moderate paranasal sinus mucosal thickening, sparing the sphenoid sinuses. Tympanic cavities and mastoids are clear. No sinus fluid levels. Other: Mild retained secretions in the nasopharynx. Visualized orbit soft tissues are within normal limits. Visualized scalp soft tissues are within normal limits. IMPRESSION: 1. Normal noncontrast CT appearance of the brain. 2. Moderate paranasal sinus inflammation. Electronically Signed   By: Odessa Fleming M.D.   On: 04/29/2021 08:37   DG Chest Port 1 View  Result Date: 04/29/2021 CLINICAL DATA:  Male of unknown age with altered mental status, delirium. EXAM: PORTABLE CHEST 1 VIEW COMPARISON:  No definite priors. FINDINGS: Portable AP supine view at 0815 hours. Mildly rotated to the left with somewhat low lung volumes. Normal cardiac size and mediastinal contours. Visualized tracheal air column is within normal limits. Allowing for portable technique the lungs are clear. No pneumothorax or pleural effusion evident on this supine view. Negative visible bowel gas, osseous structures. IMPRESSION: No acute cardiopulmonary abnormality.  Electronically Signed   By: Odessa Fleming M.D.   On: 04/29/2021 08:43    Procedures .Critical Care  Date/Time: 04/29/2021 8:15 AM Performed by: Paris Lore, PA-C Authorized by: Paris Lore, PA-C   Critical care provider statement:    Critical care time (minutes):  45   Critical care was time spent personally by me on the following activities:  Discussions with consultants, evaluation of patient's response to treatment, examination of patient, ordering and performing treatments and interventions, ordering and review of laboratory studies, ordering and review of radiographic studies, pulse oximetry, re-evaluation of patient's condition, obtaining history from patient or surrogate and review of old charts   Medications Ordered in ED Medications  sodium chloride 0.9 % bolus 1,000 mL (0 mLs Intravenous Stopped 04/29/21 1114)    ED Course  I have reviewed the triage vital signs and the nursing notes.  Pertinent labs & imaging results that were available during my care of the patient were reviewed by me and considered in my medical decision making (see chart for details).    MDM Rules/Calculators/A&P                         Unidentified male who appears to be in his 35s who presents via EMS after being found with altered mental status in a parking lot, where he was noted to be for several hours.  The differential diagnosis for AMS is extensive and includes, but is not limited to:  Drug overdose - opioids, alcohol, sedatives, antipsychotics, drug withdrawal, others Metabolic: hypoxia, hypoglycemia, hyperglycemia, hypercalcemia, hypernatremia, hyponatremia, uremia, hepatic encephalopathy, hypothyroidism, hyperthyroidism, vitamin B12 or thiamine deficiency, carbon monoxide poisoning, Wilson's disease, Lactic acidosis, DKA/HHOS Infectious: meningitis, encephalitis, bacteremia/sepsis, urinary tract infection, pneumonia, neurosyphilis Structural: Space-occupying lesion, (brain tumor,  subdural hematoma, hydrocephalus,) Vascular: stroke, subarachnoid hemorrhage, coronary ischemia, hypertensive encephalopathy, CNS vasculitis, thrombotic thrombocytopenic purpura, disseminated intravascular coagulation, hyperviscosity Psychiatric: Schizophrenia, depression; Other: Seizure, hypothermia, heat stroke, ICU psychosis, dementia -"sundowning."  Hypertensive on intake, vital signs otherwise normal.  Cardiopulmonary exam is normal, abdominal  exam is benign.  Skin exam without evidence of laceration, abrasions, or open wounds.  There are no deformities on musculoskeletal exam.  Head is normocephalic and atraumatic.  Patient is moving all 4 extremities spontaneously and without difficulty, moving around in the bed.  Patient is unable to follow commands, unable to answer any questions, GCS of 8.  Will proceed with broad work-up at this time as the differential is wide and I have very little insight into this patient's presentation.  Work-up largely unremarkable, CMP with mild anemia with hemoglobin of 12.7, CMP with hypokalemia of 3.3, coag studies normal, CK mildly elevated to 770, fluid bolus given.  Lactic is normal.  VBG is normal.  Chest x-ray negative for acute cardiopulmonary disease.  CT of the head negative for acute intracranial abnormality.  Patient reevaluated and continues to be far too somnolent to wake completely to converse.  Moving around in his bed, swats away when covers are pulled off.  Patient reevaluated with improved level of consciousness.  He is arousable and informed me that his name is Paul Campbell and his date of birth is 1990-11-07.  RN informed, will attempt to register under this name.  No further information could be gleaned from the patient at this time. At this point in his work-up, suspect component of drug use for presentation given overall very reassuring work-up.  We will continue outpatient metabolize to increase level of consciousness.  Patient reevaluated  after a few more hours of sleep now arousable and conversant.  Having tolerating p.o. with 2 meals in the last 1 hour.  Though still quite sleepy, he is able to write much more insight into his presentation.  States that he used marijuana and cocaine yesterday and does not recall any of the incidences leading up to his admission to the ED today.  At this time he denies any pain, trouble breathing, headache, or confusion.  Repeat neurologic exam is without focal deficit at this time.  GCS 15.  Patient did inform this provider that he is homeless and regularly uses cocaine and marijuana.  Believes he may have ingested drugs laced with an unknown substance that caused his abnormal reaction today.  Do suspect that this is an episode of substance-induced psychosis/altered mental status.  Given patient has returned to baseline mental status without focal deficit on neurologic exam, tolerating p.o., and is asymptomatic at this time, do not feel any further work-up is warranted in the ED.  Krystle voiced understanding with medical evaluation and treatment.  She was questions was answered to his expressed satisfaction.  Return precautions are given.,  Patient is well-appearing, stable, and appropriate for discharge at this time.  This chart was dictated using voice recognition software, Dragon. Despite the best efforts of this provider to proofread and correct errors, errors may still occur which can change documentation meaning.   Final Clinical Impression(s) / ED Diagnoses Final diagnoses:  Transient alteration of awareness    Rx / DC Orders ED Discharge Orders     None        Sherrilee GillesSponseller, Brayleigh Rybacki R, PA-C 04/30/21 1136    Mancel BaleWentz, Elliott, MD 04/30/21 1820

## 2021-04-29 NOTE — ED Notes (Signed)
Patient awake and eating.

## 2021-11-02 ENCOUNTER — Emergency Department (HOSPITAL_COMMUNITY)
Admission: EM | Admit: 2021-11-02 | Discharge: 2021-11-02 | Disposition: A | Discharge status: Home / Self Care | Payer: Self-pay | Attending: Emergency Medicine | Admitting: Emergency Medicine

## 2021-11-02 ENCOUNTER — Other Ambulatory Visit (HOSPITAL_COMMUNITY): Payer: Self-pay

## 2021-11-02 ENCOUNTER — Encounter (HOSPITAL_COMMUNITY): Payer: Self-pay | Admitting: Emergency Medicine

## 2021-11-02 DIAGNOSIS — M79671 Pain in right foot: Secondary | ICD-10-CM | POA: Insufficient documentation

## 2021-11-02 DIAGNOSIS — R748 Abnormal levels of other serum enzymes: Secondary | ICD-10-CM | POA: Insufficient documentation

## 2021-11-02 DIAGNOSIS — F1410 Cocaine abuse, uncomplicated: Secondary | ICD-10-CM | POA: Insufficient documentation

## 2021-11-02 DIAGNOSIS — F1520 Other stimulant dependence, uncomplicated: Secondary | ICD-10-CM | POA: Insufficient documentation

## 2021-11-02 DIAGNOSIS — M79672 Pain in left foot: Secondary | ICD-10-CM | POA: Insufficient documentation

## 2021-11-02 DIAGNOSIS — F1910 Other psychoactive substance abuse, uncomplicated: Secondary | ICD-10-CM

## 2021-11-02 DIAGNOSIS — R001 Bradycardia, unspecified: Secondary | ICD-10-CM | POA: Insufficient documentation

## 2021-11-02 LAB — CBC WITH DIFFERENTIAL/PLATELET
Abs Immature Granulocytes: 0.02 10*3/uL (ref 0.00–0.07)
Basophils Absolute: 0 10*3/uL (ref 0.0–0.1)
Basophils Relative: 0 %
Eosinophils Absolute: 0.2 10*3/uL (ref 0.0–0.5)
Eosinophils Relative: 2 %
HCT: 41 % (ref 39.0–52.0)
Hemoglobin: 14 g/dL (ref 13.0–17.0)
Immature Granulocytes: 0 %
Lymphocytes Relative: 20 %
Lymphs Abs: 1.4 10*3/uL (ref 0.7–4.0)
MCH: 31.6 pg (ref 26.0–34.0)
MCHC: 34.1 g/dL (ref 30.0–36.0)
MCV: 92.6 fL (ref 80.0–100.0)
Monocytes Absolute: 0.5 10*3/uL (ref 0.1–1.0)
Monocytes Relative: 7 %
Neutro Abs: 4.9 10*3/uL (ref 1.7–7.7)
Neutrophils Relative %: 71 %
Platelets: 346 10*3/uL (ref 150–400)
RBC: 4.43 MIL/uL (ref 4.22–5.81)
RDW: 13.7 % (ref 11.5–15.5)
WBC: 7 10*3/uL (ref 4.0–10.5)
nRBC: 0 % (ref 0.0–0.2)

## 2021-11-02 LAB — COMPREHENSIVE METABOLIC PANEL
ALT: 16 U/L (ref 0–44)
AST: 34 U/L (ref 15–41)
Albumin: 4.4 g/dL (ref 3.5–5.0)
Alkaline Phosphatase: 72 U/L (ref 38–126)
Anion gap: 10 (ref 5–15)
BUN: 18 mg/dL (ref 6–20)
CO2: 24 mmol/L (ref 22–32)
Calcium: 9.3 mg/dL (ref 8.9–10.3)
Chloride: 104 mmol/L (ref 98–111)
Creatinine, Ser: 1.07 mg/dL (ref 0.61–1.24)
GFR, Estimated: >60 mL/min (ref >60)
Glucose, Bld: 94 mg/dL (ref 70–99)
Potassium: 3.7 mmol/L (ref 3.5–5.1)
Sodium: 138 mmol/L (ref 135–145)
Total Bilirubin: 0.2 mg/dL — ABNORMAL LOW (ref 0.3–1.2)
Total Protein: 8 g/dL (ref 6.5–8.1)

## 2021-11-02 LAB — URINALYSIS, ROUTINE W REFLEX MICROSCOPIC
Bilirubin Urine: NEGATIVE
Glucose, UA: NEGATIVE mg/dL
Ketones, ur: 20 mg/dL — AB
Leukocytes,Ua: NEGATIVE
Nitrite: NEGATIVE
Protein, ur: 30 mg/dL — AB
Specific Gravity, Urine: 1.03 (ref 1.005–1.030)
pH: 6 (ref 5.0–8.0)

## 2021-11-02 LAB — RAPID URINE DRUG SCREEN, HOSP PERFORMED
Amphetamines: POSITIVE — AB
Barbiturates: NOT DETECTED
Benzodiazepines: NOT DETECTED
Cocaine: POSITIVE — AB
Opiates: NOT DETECTED
Tetrahydrocannabinol: POSITIVE — AB

## 2021-11-02 LAB — CK: Total CK: 817 U/L — ABNORMAL HIGH (ref 49–397)

## 2021-11-02 MED ORDER — IBUPROFEN 600 MG PO TABS
600.0000 mg | ORAL_TABLET | Freq: Four times a day (QID) | ORAL | 0 refills | Status: DC | PRN
  Filled 2021-11-02: qty 30, 8d supply, fill #0

## 2021-11-02 NOTE — ED Notes (Signed)
Pt requested 2 additional sandwiches and television remote. Answering questions more readily.
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
## 2021-11-02 NOTE — ED Notes (Signed)
TOC consulted for homelessness resources. CSW reached out to RN for ED fax number as CSW is remote from another campus. CSW faxed homelessness resources over for pt. RN states that she will provide them to pt in room when the arrive. TOC signing off.

## 2021-11-02 NOTE — ED Notes (Signed)
Pt asking for sandwich, provided by PA

## 2021-11-02 NOTE — Discharge Instructions (Addendum)
You were seen here today for evaluation after you took methamphetamines and crack cocaine. Your lab work shows some dehydration, otherwise normal. You tested positive for cocaine, meth, and THC. Homeless shelters and other resources were given to you. Please stop using these drugs.   For your chronic foot pain, it is likely due to your homelessness. I do not see any signs of infection or swelling. I recommend propping your feet up whenever you are not actively on them.
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
## 2021-11-12 ENCOUNTER — Other Ambulatory Visit (HOSPITAL_COMMUNITY): Payer: Self-pay

## 2022-03-18 ENCOUNTER — Other Ambulatory Visit: Payer: Self-pay

## 2022-03-18 ENCOUNTER — Encounter (HOSPITAL_COMMUNITY): Payer: Self-pay

## 2022-03-18 ENCOUNTER — Emergency Department (HOSPITAL_COMMUNITY)
Admission: EM | Admit: 2022-03-18 | Discharge: 2022-03-18 | Disposition: A | Discharge status: Home / Self Care | Payer: Self-pay | Attending: Emergency Medicine | Admitting: Emergency Medicine

## 2022-03-18 DIAGNOSIS — W540XXA Bitten by dog, initial encounter: Secondary | ICD-10-CM | POA: Insufficient documentation

## 2022-03-18 DIAGNOSIS — S0181XA Laceration without foreign body of other part of head, initial encounter: Secondary | ICD-10-CM | POA: Insufficient documentation

## 2022-03-18 MED ORDER — RABIES VACCINE, PCEC IM SUSR
1.0000 mL | Freq: Once | INTRAMUSCULAR | Status: DC
  Filled 2022-03-18: qty 1

## 2022-03-18 MED ORDER — BACITRACIN ZINC 500 UNIT/GM EX OINT
TOPICAL_OINTMENT | Freq: Two times a day (BID) | CUTANEOUS | Status: DC
  Administered 2022-03-18: 1 via TOPICAL
  Filled 2022-03-18: qty 1.8

## 2022-03-18 MED ORDER — RABIES IMMUNE GLOBULIN 150 UNIT/ML IM INJ
20.0000 [IU]/kg | INJECTION | Freq: Once | INTRAMUSCULAR | Status: DC
  Filled 2022-03-18: qty 8

## 2022-03-18 MED ORDER — AMOXICILLIN-POT CLAVULANATE 875-125 MG PO TABS: 1.0000 | ORAL_TABLET | Freq: Two times a day (BID) | ORAL | 0 refills | Status: DC

## 2022-03-18 MED ORDER — TETANUS-DIPHTH-ACELL PERTUSSIS 5-2.5-18.5 LF-MCG/0.5 IM SUSY
0.5000 mL | PREFILLED_SYRINGE | Freq: Once | INTRAMUSCULAR | Status: DC
  Filled 2022-03-18: qty 0.5

## 2022-03-18 NOTE — ED Notes (Signed)
I provided reinforced discharge education based off of discharge instructions. Pt acknowledged and understood my education. Pt had no further questions/concerns for provider/myself.  °

## 2022-03-18 NOTE — ED Provider Triage Note (Signed)
Emergency Medicine Provider Triage Evaluation Note  Paul Campbell , a 31 y.o. male  was evaluated in triage.  Pt complains of dog bite. States that 2 days ago he was bitten in the face by a pitbull. He denies any loss of consciousness. Unsure if the dog is up to date on vaccinations. Denies any fevers or chills  Review of Systems  Positive:  Negative:   Physical Exam  BP (!) 144/92 (BP Location: Right Arm)   Pulse (!) 115   Temp 99.1 F (37.3 C) (Oral)   Resp 14   Ht 5\' 6"  (1.676 m)   Wt 59 kg   SpO2 99%   BMI 20.98 kg/m  Gen:   Awake, no distress   Resp:  Normal effort  MSK:   Moves extremities without difficulty  Other:  4 cm gaping laceration noted to the right cheek. Swelling noted to same. No infectious signs or bleeding.  Medical Decision Making  Medically screening exam initiated at 5:28 PM.  Appropriate orders placed.  Campbell was informed that the remainder of the evaluation will be completed by another provider, this initial triage assessment does not replace that evaluation, and the importance of remaining in the ED until their evaluation is complete.     Lerry Liner, PA-C 03/18/22 1730

## 2022-03-18 NOTE — ED Notes (Addendum)
I informed pt the benefits of rabies vaccines, and Tdap vaccine, I informed pt that the risks of not receiving vaccine series, pt expressed refusal. PA-C notified. I was providing T-DAP vaccine due to pt expressed he was okay receiving it, upon attempt to provide injection, pt withdrew his arm and refused T-DAP vaccine.

## 2022-03-18 NOTE — ED Triage Notes (Signed)
Patient states he was bitten on the right side of his face 2 nights ago. Patient states it was an acquaintance's dog and does not know if the dog was properly vaccinated. Patient has right facial swelling .

## 2022-03-18 NOTE — ED Provider Notes (Signed)
[facility]-EMERGENCY DEPT Provider Note   CSN: [id number] Arrival date & time: 03/18/22  1640     History  Chief Complaint  Patient presents with   Animal Bite    Paul Campbell is a 31 y.o. male.  Patient with history of polysubstance abuse presents for evaluation with complaints of dog bite.  He states that last Saturday early in the morning he was bitten by his neighbors pitbull.  He denies any loss of consciousness.  He is unsure if the dog is up to date on vaccinations and states that he does not have his neighbor's contact information. Denies any fevers or chills. Patient is unsure of his last tetanus.  The history is provided by the patient. No language interpreter was used.  Animal Bite      Home Medications Prior to Admission medications   Medication Sig Start Date End Date Taking? Authorizing Provider  ibuprofen (ADVIL) 600 MG tablet Take 1 tablet by mouth every 6 hours as needed. 11/02/21   Achille Rich, PA-C      Allergies    Patient has no known allergies.    Review of Systems   Review of Systems  Skin:  Positive for wound.  All other systems reviewed and are negative.   Physical Exam Updated Vital Signs BP (!) 144/92 (BP Location: Right Arm)   Pulse (!) 115   Temp 99.1 F (37.3 C) (Oral)   Resp 14   Ht 5\' 6"  (1.676 m)   Wt 59 kg   SpO2 99%   BMI 20.98 kg/m  Physical Exam Vitals and nursing note reviewed.  Constitutional:      General: He is not in acute distress.    Appearance: Normal appearance. He is normal weight. He is not ill-appearing, toxic-appearing or diaphoretic.  HENT:     Head: Normocephalic and atraumatic.  Cardiovascular:     Rate and Rhythm: Normal rate.  Pulmonary:     Effort: Pulmonary effort is normal. No respiratory distress.  Musculoskeletal:        General: Normal range of motion.     Cervical back: Normal range of motion.  Skin:    General: Skin is warm and dry.     Comments: 4 cm gaping  laceration noted to the right cheek and approximately 1 mm into the vermilion border. Skin over the area of the wound that crosses the vermillion border is well adhered without any gaping wound. Does not transverse into the buccal mucosa. No abnormalities noted to the inside of the buccal mucosa. Wound has clearly already began to heal with tissue adherence within the wound. Mild underlying swelling without erythema, warmth, crepitus, or purulence. No active bleeding. No signs of foreign body.  Neurological:     General: No focal deficit present.     Mental Status: He is alert.  Psychiatric:        Mood and Affect: Mood normal.        Behavior: Behavior normal.     ED Results / Procedures / Treatments   Labs (all labs ordered are listed, but only abnormal results are displayed) Labs Reviewed - No data to display  EKG None  Radiology No results found.  Procedures Procedures    Medications Ordered in ED Medications  bacitracin ointment (1 Application Topical Given 03/18/22 1917)  Tdap (BOOSTRIX) injection 0.5 mL (0.5 mLs Intramuscular Patient Refused/Not Given 03/18/22 1913)  rabies vaccine (RABAVERT) injection 1 mL (1 mL Intramuscular Patient Refused/Not Given 03/18/22  1918)  rabies immune globulin (HYPERAB/KEDRAB) injection 1,200 Units (1,200 Units Intramuscular Patient Refused/Not Given 03/18/22 1919)    ED Course/ Medical Decision Making/ A&P                           Medical Decision Making Risk OTC drugs. Prescription drug management.   Patient presents today with facial laceration from animal bite that occurred 2.5 days ago. Wound is already showing signs of healing and does not traverse the buccal mucosa. Given this, laceration repair is not indicated. Wound cleaned with pressure irrigation and base of wound explored without evidence of foreign body. Topical bacitracin applied with non-adherent dressing. Offered tdap and rabies vaccination which patient adamantly refused.  Educated on the risks of refusing both as well the  importance of following up with his neighbor to inquire about the dogs vaccination status and to return for vaccinations if unvaccinated. Also informed on red flag symptoms that would prompt immediate return. Will give Augmentin for dog bite infection prophylaxis.  Patient educated on the importance of filling this medication and taking it in its entirety.  He is otherwise stable for discharge at this time.  He is understanding and amenable with plan.  Discharged in stable condition.   Final Clinical Impression(s) / ED Diagnoses Final diagnoses:  Dog bite of face, initial encounter    Rx / DC Orders ED Discharge Orders          Ordered    amoxicillin-clavulanate (AUGMENTIN) 875-125 MG tablet  Every 12 hours        03/18/22 1945          An After Visit Summary was printed and given to the patient.     Silva Bandy, PA-C 03/18/22 1947    Rozelle Logan, DO 03/18/22 2057

## 2022-03-18 NOTE — Discharge Instructions (Signed)
As we discussed, it has been too long since your injury occurred to warrant stitches.  Your wound was cleaned and irrigated in the emergency department and topical antibiotic ointment was placed to help prevent infections.  Additionally you did refuse tetanus and rabies vaccinations and I have informed you the risks of refusing these shots.  I highly recommend that you follow-up with your neighbor to determine the dog's vaccination status and return for rabies shots particularly if the dog is not vaccinated.  Additionally, given that dog bites have a high likelihood of getting infected, I have given you a prescription for an antibiotic to take as prescribed in its entirety for prevention of this.  Return if development of any new or worsening symptoms.

## 2022-06-25 ENCOUNTER — Other Ambulatory Visit (HOSPITAL_COMMUNITY): Payer: Self-pay

## 2022-07-24 ENCOUNTER — Emergency Department (HOSPITAL_COMMUNITY)
Admission: EM | Admit: 2022-07-24 | Discharge: 2022-07-25 | Disposition: A | Discharge status: Home / Self Care | Payer: Self-pay | Attending: Emergency Medicine | Admitting: Emergency Medicine

## 2022-07-24 ENCOUNTER — Encounter (HOSPITAL_COMMUNITY): Payer: Self-pay

## 2022-07-24 ENCOUNTER — Emergency Department (HOSPITAL_COMMUNITY): Payer: Self-pay

## 2022-07-24 ENCOUNTER — Other Ambulatory Visit: Payer: Self-pay

## 2022-07-24 DIAGNOSIS — R410 Disorientation, unspecified: Secondary | ICD-10-CM | POA: Insufficient documentation

## 2022-07-24 DIAGNOSIS — S02601A Fracture of unspecified part of body of right mandible, initial encounter for closed fracture: Secondary | ICD-10-CM | POA: Insufficient documentation

## 2022-07-24 DIAGNOSIS — R4189 Other symptoms and signs involving cognitive functions and awareness: Secondary | ICD-10-CM

## 2022-07-24 DIAGNOSIS — F0781 Postconcussional syndrome: Secondary | ICD-10-CM | POA: Insufficient documentation

## 2022-07-24 DIAGNOSIS — R4182 Altered mental status, unspecified: Secondary | ICD-10-CM | POA: Insufficient documentation

## 2022-07-24 DIAGNOSIS — Z1152 Encounter for screening for COVID-19: Secondary | ICD-10-CM | POA: Insufficient documentation

## 2022-07-24 LAB — ETHANOL: Alcohol, Ethyl (B): <10 mg/dL (ref <10)

## 2022-07-24 LAB — I-STAT CHEM 8, ED
BUN: 16 mg/dL (ref 6–20)
Calcium, Ion: 1.09 mmol/L — ABNORMAL LOW (ref 1.15–1.40)
Chloride: 105 mmol/L (ref 98–111)
Creatinine, Ser: 1.3 mg/dL — ABNORMAL HIGH (ref 0.61–1.24)
Glucose, Bld: 96 mg/dL (ref 70–99)
HCT: 38 % — ABNORMAL LOW (ref 39.0–52.0)
Hemoglobin: 12.9 g/dL — ABNORMAL LOW (ref 13.0–17.0)
Potassium: 3.5 mmol/L (ref 3.5–5.1)
Sodium: 142 mmol/L (ref 135–145)
TCO2: 24 mmol/L (ref 22–32)

## 2022-07-24 LAB — COMPREHENSIVE METABOLIC PANEL
ALT: 27 U/L (ref 0–44)
AST: 58 U/L — ABNORMAL HIGH (ref 15–41)
Albumin: 4.4 g/dL (ref 3.5–5.0)
Alkaline Phosphatase: 66 U/L (ref 38–126)
Anion gap: 13 (ref 5–15)
BUN: 16 mg/dL (ref 6–20)
CO2: 23 mmol/L (ref 22–32)
Calcium: 9.3 mg/dL (ref 8.9–10.3)
Chloride: 106 mmol/L (ref 98–111)
Creatinine, Ser: 1.36 mg/dL — ABNORMAL HIGH (ref 0.61–1.24)
GFR, Estimated: >60 mL/min (ref >60)
Glucose, Bld: 99 mg/dL (ref 70–99)
Potassium: 3.5 mmol/L (ref 3.5–5.1)
Sodium: 142 mmol/L (ref 135–145)
Total Bilirubin: 1.1 mg/dL (ref 0.3–1.2)
Total Protein: 8.1 g/dL (ref 6.5–8.1)

## 2022-07-24 LAB — CBC WITH DIFFERENTIAL/PLATELET
Abs Immature Granulocytes: 0.02 10*3/uL (ref 0.00–0.07)
Basophils Absolute: 0 10*3/uL (ref 0.0–0.1)
Basophils Relative: 0 %
Eosinophils Absolute: 0.2 10*3/uL (ref 0.0–0.5)
Eosinophils Relative: 2 %
HCT: 36.8 % — ABNORMAL LOW (ref 39.0–52.0)
Hemoglobin: 12.6 g/dL — ABNORMAL LOW (ref 13.0–17.0)
Immature Granulocytes: 0 %
Lymphocytes Relative: 13 %
Lymphs Abs: 1.2 10*3/uL (ref 0.7–4.0)
MCH: 31.5 pg (ref 26.0–34.0)
MCHC: 34.2 g/dL (ref 30.0–36.0)
MCV: 92 fL (ref 80.0–100.0)
Monocytes Absolute: 0.9 10*3/uL (ref 0.1–1.0)
Monocytes Relative: 9 %
Neutro Abs: 7.5 10*3/uL (ref 1.7–7.7)
Neutrophils Relative %: 76 %
Platelets: 282 10*3/uL (ref 150–400)
RBC: 4 MIL/uL — ABNORMAL LOW (ref 4.22–5.81)
RDW: 12.9 % (ref 11.5–15.5)
WBC: 9.8 10*3/uL (ref 4.0–10.5)
nRBC: 0 % (ref 0.0–0.2)

## 2022-07-24 LAB — SALICYLATE LEVEL: Salicylate Lvl: <7 mg/dL — ABNORMAL LOW (ref 7.0–30.0)

## 2022-07-24 LAB — ACETAMINOPHEN LEVEL: Acetaminophen (Tylenol), Serum: <10 ug/mL — ABNORMAL LOW (ref 10–30)

## 2022-07-24 LAB — CBG MONITORING, ED: Glucose-Capillary: 92 mg/dL (ref 70–99)

## 2022-07-24 MED ORDER — MORPHINE SULFATE (PF) 4 MG/ML IV SOLN
4.0000 mg | Freq: Once | INTRAVENOUS | Status: AC
  Administered 2022-07-24: 4 mg via INTRAVENOUS
  Filled 2022-07-24: qty 1

## 2022-07-24 MED ORDER — MIDAZOLAM HCL 2 MG/2ML IJ SOLN
4.0000 mg | Freq: Once | INTRAMUSCULAR | Status: AC
  Administered 2022-07-24: 4 mg via INTRAVENOUS
  Filled 2022-07-24: qty 4

## 2022-07-24 MED ORDER — DIAZEPAM 5 MG/ML IJ SOLN
10.0000 mg | Freq: Once | INTRAMUSCULAR | Status: AC
Start: 2022-07-24 — End: 2022-07-24
  Administered 2022-07-24: 10 mg via INTRAVENOUS
  Filled 2022-07-24: qty 2

## 2022-07-24 MED ORDER — ONDANSETRON HCL 4 MG/2ML IJ SOLN
4.0000 mg | Freq: Once | INTRAMUSCULAR | Status: AC
  Administered 2022-07-24: 4 mg via INTRAVENOUS
  Filled 2022-07-24: qty 2

## 2022-07-24 MED ORDER — SODIUM CHLORIDE 0.9 % IV BOLUS
1000.0000 mL | Freq: Once | INTRAVENOUS | Status: AC
  Administered 2022-07-24: 1000 mL via INTRAVENOUS

## 2022-07-24 MED ORDER — LORAZEPAM 2 MG/ML IJ SOLN
1.0000 mg | Freq: Once | INTRAMUSCULAR | Status: AC
  Administered 2022-07-24: 1 mg via INTRAVENOUS
  Filled 2022-07-24: qty 1

## 2022-07-24 NOTE — ED Notes (Signed)
Pt wandering around triage and agitated that staff will not give him water.

## 2022-07-24 NOTE — ED Notes (Signed)
Verbal report given to Malinda M RN at this time 

## 2022-07-24 NOTE — ED Notes (Signed)
ED provider at bedside.
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
## 2022-07-24 NOTE — ED Triage Notes (Signed)
Pt is very hard to understand what the patient is saying. He is spitting up blood, crying and very agitated during triage.
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
## 2022-07-24 NOTE — ED Notes (Signed)
Spoke with Dr. Darl Householder and was advised to let the pt sleep to not wake the pt up to let him sleep

## 2022-07-24 NOTE — ED Notes (Signed)
Received verbal report from Ashley E RN at this time 
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
## 2022-07-25 ENCOUNTER — Emergency Department (HOSPITAL_COMMUNITY): Payer: Self-pay

## 2022-07-25 ENCOUNTER — Other Ambulatory Visit (HOSPITAL_COMMUNITY): Payer: Self-pay

## 2022-07-25 LAB — SARS CORONAVIRUS 2 BY RT PCR: SARS Coronavirus 2 by RT PCR: NEGATIVE

## 2022-07-25 MED ORDER — AMOXICILLIN 250 MG/5ML PO SUSR: 500.0000 mg | Freq: Three times a day (TID) | ORAL | 0 refills | Status: DC

## 2022-07-25 MED ORDER — ACETAMINOPHEN 650 MG RE SUPP
650.0000 mg | Freq: Once | RECTAL | Status: AC
  Administered 2022-07-25: 650 mg via RECTAL
  Filled 2022-07-25: qty 1

## 2022-07-25 MED ORDER — AMOXICILLIN 250 MG/5ML PO SUSR
500.0000 mg | Freq: Three times a day (TID) | ORAL | 0 refills | Status: DC
  Filled 2022-07-25: qty 200, 7d supply, fill #0

## 2022-07-25 MED ORDER — HYDROCODONE-ACETAMINOPHEN 7.5-325 MG/15ML PO SOLN: 10.0000 mL | Freq: Four times a day (QID) | ORAL | 0 refills | Status: DC | PRN

## 2022-07-25 MED ORDER — HYDROCODONE-ACETAMINOPHEN 7.5-325 MG/15ML PO SOLN
10.0000 mL | Freq: Four times a day (QID) | ORAL | 0 refills | Status: AC | PRN
  Filled 2022-07-25: qty 50, 2d supply, fill #0
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
## 2022-07-25 NOTE — Discharge Instructions (Addendum)
Go to Dr Eusebio Friendly office at 2 PM for your appointment!

## 2022-07-25 NOTE — ED Notes (Signed)
All discharge instructions reviewed with patient and patient verbalized understanding of same. TOC to bring up prescriptions for patient and bus pass will be provided to patient prior to leaving department.

## 2022-07-26 ENCOUNTER — Telehealth: Payer: Self-pay | Admitting: *Deleted

## 2022-07-26 NOTE — Telephone Encounter (Signed)
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 No alt numbers nor alt contacts.

## 2022-07-31 ENCOUNTER — Encounter (HOSPITAL_BASED_OUTPATIENT_CLINIC_OR_DEPARTMENT_OTHER): Payer: Self-pay

## 2022-07-31 ENCOUNTER — Ambulatory Visit (HOSPITAL_BASED_OUTPATIENT_CLINIC_OR_DEPARTMENT_OTHER): Admit: 2022-07-31 | Payer: Self-pay | Admitting: Plastic Surgery

## 2022-07-31 ENCOUNTER — Inpatient Hospital Stay (HOSPITAL_COMMUNITY)
Admission: EM | Admit: 2022-07-31 | Discharge: 2022-08-03 | DRG: 141 | Disposition: A | Discharge status: Home / Self Care | Payer: Self-pay | Attending: Family Medicine | Admitting: Family Medicine

## 2022-07-31 ENCOUNTER — Emergency Department (HOSPITAL_COMMUNITY): Payer: Self-pay

## 2022-07-31 DIAGNOSIS — Z91199 Patient's noncompliance with other medical treatment and regimen due to unspecified reason: Secondary | ICD-10-CM

## 2022-07-31 DIAGNOSIS — S02652B Fracture of angle of left mandible, initial encounter for open fracture: Secondary | ICD-10-CM | POA: Diagnosis present

## 2022-07-31 DIAGNOSIS — S02609A Fracture of mandible, unspecified, initial encounter for closed fracture: Secondary | ICD-10-CM

## 2022-07-31 DIAGNOSIS — S02601B Fracture of unspecified part of body of right mandible, initial encounter for open fracture: Principal | ICD-10-CM | POA: Diagnosis present

## 2022-07-31 DIAGNOSIS — L03211 Cellulitis of face: Secondary | ICD-10-CM | POA: Diagnosis present

## 2022-07-31 DIAGNOSIS — Z5900 Homelessness unspecified: Secondary | ICD-10-CM

## 2022-07-31 DIAGNOSIS — Y9339 Activity, other involving climbing, rappelling and jumping off: Secondary | ICD-10-CM

## 2022-07-31 DIAGNOSIS — F1410 Cocaine abuse, uncomplicated: Secondary | ICD-10-CM | POA: Diagnosis present

## 2022-07-31 DIAGNOSIS — W1789XA Other fall from one level to another, initial encounter: Secondary | ICD-10-CM | POA: Diagnosis present

## 2022-07-31 DIAGNOSIS — F1490 Cocaine use, unspecified, uncomplicated: Secondary | ICD-10-CM

## 2022-07-31 DIAGNOSIS — S02609D Fracture of mandible, unspecified, subsequent encounter for fracture with routine healing: Secondary | ICD-10-CM

## 2022-07-31 DIAGNOSIS — Z720 Tobacco use: Secondary | ICD-10-CM

## 2022-07-31 DIAGNOSIS — F1994 Other psychoactive substance use, unspecified with psychoactive substance-induced mood disorder: Secondary | ICD-10-CM | POA: Diagnosis present

## 2022-07-31 DIAGNOSIS — R1310 Dysphagia, unspecified: Secondary | ICD-10-CM | POA: Diagnosis present

## 2022-07-31 DIAGNOSIS — F17210 Nicotine dependence, cigarettes, uncomplicated: Secondary | ICD-10-CM | POA: Diagnosis present

## 2022-07-31 DIAGNOSIS — S022XXA Fracture of nasal bones, initial encounter for closed fracture: Secondary | ICD-10-CM | POA: Diagnosis present

## 2022-07-31 DIAGNOSIS — R4182 Altered mental status, unspecified: Secondary | ICD-10-CM | POA: Diagnosis present

## 2022-07-31 LAB — CBC WITH DIFFERENTIAL/PLATELET
Abs Immature Granulocytes: 0.02 10*3/uL (ref 0.00–0.07)
Basophils Absolute: 0 10*3/uL (ref 0.0–0.1)
Basophils Relative: 0 %
Eosinophils Absolute: 0.2 10*3/uL (ref 0.0–0.5)
Eosinophils Relative: 3 %
HCT: 35.7 % — ABNORMAL LOW (ref 39.0–52.0)
Hemoglobin: 11.6 g/dL — ABNORMAL LOW (ref 13.0–17.0)
Immature Granulocytes: 0 %
Lymphocytes Relative: 22 %
Lymphs Abs: 1.6 10*3/uL (ref 0.7–4.0)
MCH: 31.1 pg (ref 26.0–34.0)
MCHC: 32.5 g/dL (ref 30.0–36.0)
MCV: 95.7 fL (ref 80.0–100.0)
Monocytes Absolute: 1 10*3/uL (ref 0.1–1.0)
Monocytes Relative: 14 %
Neutro Abs: 4.5 10*3/uL (ref 1.7–7.7)
Neutrophils Relative %: 61 %
Platelets: 356 10*3/uL (ref 150–400)
RBC: 3.73 MIL/uL — ABNORMAL LOW (ref 4.22–5.81)
RDW: 12.9 % (ref 11.5–15.5)
WBC: 7.3 10*3/uL (ref 4.0–10.5)
nRBC: 0 % (ref 0.0–0.2)

## 2022-07-31 LAB — COMPREHENSIVE METABOLIC PANEL
ALT: 30 U/L (ref 0–44)
AST: 51 U/L — ABNORMAL HIGH (ref 15–41)
Albumin: 3.3 g/dL — ABNORMAL LOW (ref 3.5–5.0)
Alkaline Phosphatase: 49 U/L (ref 38–126)
Anion gap: 12 (ref 5–15)
BUN: 12 mg/dL (ref 6–20)
CO2: 23 mmol/L (ref 22–32)
Calcium: 8.3 mg/dL — ABNORMAL LOW (ref 8.9–10.3)
Chloride: 103 mmol/L (ref 98–111)
Creatinine, Ser: 0.96 mg/dL (ref 0.61–1.24)
GFR, Estimated: >60 mL/min (ref >60)
Glucose, Bld: 82 mg/dL (ref 70–99)
Potassium: 3.7 mmol/L (ref 3.5–5.1)
Sodium: 138 mmol/L (ref 135–145)
Total Bilirubin: 0.7 mg/dL (ref 0.3–1.2)
Total Protein: 6.8 g/dL (ref 6.5–8.1)

## 2022-07-31 LAB — ETHANOL: Alcohol, Ethyl (B): <10 mg/dL (ref <10)

## 2022-07-31 LAB — ACETAMINOPHEN LEVEL: Acetaminophen (Tylenol), Serum: <10 ug/mL — ABNORMAL LOW (ref 10–30)

## 2022-07-31 LAB — SALICYLATE LEVEL: Salicylate Lvl: <7 mg/dL — ABNORMAL LOW (ref 7.0–30.0)

## 2022-07-31 SURGERY — OPEN REDUCTION INTERNAL FIXATION (ORIF) MANDIBULAR FRACTURE
Anesthesia: General | Site: Face

## 2022-07-31 MED ORDER — LORAZEPAM 2 MG/ML IJ SOLN
1.0000 mg | Freq: Once | INTRAMUSCULAR | Status: AC
  Administered 2022-07-31: 1 mg via INTRAVENOUS
  Filled 2022-07-31: qty 1

## 2022-07-31 MED ORDER — SODIUM CHLORIDE 0.9 % IV SOLN
3.0000 g | Freq: Four times a day (QID) | INTRAVENOUS | Status: DC
  Administered 2022-07-31 – 2022-08-03 (×11): 3 g via INTRAVENOUS
  Filled 2022-07-31 (×11): qty 8

## 2022-07-31 MED ORDER — CHLORHEXIDINE GLUCONATE 0.12 % MT SOLN
15.0000 mL | Freq: Four times a day (QID) | OROMUCOSAL | Status: DC
  Administered 2022-07-31 – 2022-08-03 (×7): 15 mL via OROMUCOSAL
  Filled 2022-07-31 (×7): qty 15

## 2022-07-31 NOTE — Progress Notes (Signed)
Pharmacy Antibiotic Note  Paul Campbell is a 31 y.o. male for which pharmacy has been consulted for unasyn dosing for cellulitis.  Estimated Creatinine Clearance: 82.7 mL/min (A) (by C-G formula based on SCr of 1.3 mg/dL (H)).  Plan: Unasyn 3g q6h Trend WBC, Fever, Renal function F/u cultures, clinical course, WBC, fever De-escalate when able     Temp (24hrs), Avg:98.5 F (36.9 C), Min:98.5 F (36.9 C), Max:98.5 F (36.9 C)  Recent Labs  Lab 07/24/22 1717  CREATININE 1.30*    Estimated Creatinine Clearance: 82.7 mL/min (A) (by C-G formula based on SCr of 1.3 mg/dL (H)).    No Known Allergies  Antimicrobials this admission: unasyn 11/1 >>   Microbiology results: Pending  Thank you for allowing pharmacy to be a part of this patient's care.  Lorelei Pont, PharmD, BCPS 07/31/2022 5:14 PM ED Clinical Pharmacist -  [phone]
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
## 2022-07-31 NOTE — ED Notes (Signed)
Patient transported to floor at this time.
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
## 2022-07-31 NOTE — Assessment & Plan Note (Addendum)
Reported use today before admission. VSS. - TOC consult for substance use - Offer nicotine patch if desired later

## 2022-07-31 NOTE — Hospital Course (Addendum)
Paul Campbell is a 31 y.o. male who presented with AMS and worsening jaw pain in setting of known bilateral mandibular fracture. PMH significant for substance induced mood disorder, cocaine use, and tobacco use. A brief hospital course is below.   Mandibular fracture ([place]) Presented with increased mandibular pain after hitting his jaw while jumping a fence. Had been seen on 10/26 and planned for o/p surgery on day of presentation. On admission, mouth was held open, R>L mandible swelling with mild erythema, and mild dried blood on lips and tongue. CT with stable fractures. He was placed on IV unasyn given bleeding and possible overlying cellulitis. Facial and reconstructive plastic surgeon Dr. Sabino Gasser consulted in ED and performed corrective surgery. By discharge, he was on *** abx, pain was controlled on oxy ***.   AMS (altered mental status) Presented with agitation after cocaine use and given 1 mg ativan. CT head without acute intracranial abnormality. His mental status improved with waning effects of cocaine and ativan.  Cocaine use, tobacco use Reported use on admission. TOC consulted for substance use, ***.  Issues for follow up: Ensure follow up outpatient with ENT Assess substance use
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
## 2022-07-31 NOTE — Consult Note (Signed)
ENT CONSULT:  Reason for Consult: Mandible fractures on cocaine  Referring Physician:  Dr. Manus Gunning  HPI: Paul Campbell is an 31 y.o. male with a history of polysubstance abuse including cocaine use possible homelessness who was seen in the emergency room 1 week ago July 25, 2022 for bilateral comminuted mandible fractures after blunt facial trauma.  He was seen by plastic surgeon Dr. Frances Furbish who who actually had the patient scheduled for outpatient surgery today.  The patient however did not show up for surgery.  He presented to the emergency room earlier this afternoon with complaint of facial pain after jumping a fence and falling on his face overnight.  He additionally has admitted to the emergency room team that he took cocaine and has tox panel pending.  Due to new facial injuries a CT scan was once again obtained.  During the time of my consultation with the patient he had received Ativan for relaxation and was nonverbal and not following instructions or commands.  The patient is considered not consentable for surgery at this time.   No past medical history on file.  No past surgical history on file.  Family History  Family history unknown: Yes    Social History:  reports that he has been smoking cigarettes. He has been smoking an average of .5 packs per day. He has never used smokeless tobacco. He reports current alcohol use. He reports current drug use. Drug: Cocaine.  Allergies: No Known Allergies  Medications: I have reviewed the patient's current medications.  No results found for this or any previous visit (from the past 48 hour(s)).  No results found.  VZC:HYIFOYDX other than stated per HPI  Blood pressure (!) 150/83, pulse 79, temperature 98.5 F (36.9 C), temperature source Oral, resp. rate (!) 25, SpO2 100 %.  PHYSICAL EXAM:  CONSTITUTIONAL: Groaning, not following commands, minimally arousable. EYES: Not opening his eyes. HENT: Head :  normocephalic and atraumatic Ears: External ears normal. Nose: nose normal and no purulence Mouth/Throat:  Mouth: Highly displaced right mandibular body fracture approximately 1 cm step-off.  No pus.  Unable to visualize mucosa of left retromolar trigone as patient was closing his mouth.  Bilateral malocclusion. Throat: oropharynx clear and moist Mucous membranes: normal EYES: conjunctiva normal, EOM normal and PERRL NECK: supple, trachea normal and no thyromegaly or cervical LAD NEURO: Unable to ascertain given patient's noncompliance and drug induced neurologic effects.  Studies Reviewed:CT maxillofacial 07/31/22  Per my read, the patient has a significantly displaced and comminuted right mandibular body fracture and a severely displaced and comminuted left mandibular angle fracture.  Assessment/Plan: 31 year old male with history of polysubstance abuse, possible homelessness who presents 1 week after initial evaluation for bilateral mandibular fractures including right mandibular body and left mandibular angle.  Patient had surgery scheduled Dr. Ulice Bold today but did not follow through with surgery.  He is currently quite high on cocaine.   #Bilateral open mandibular fractures (right body, left angle), subsequent encounter #Malocclusion #Polysusbstance abuse #Cocaine abuse  I discussed the case with Dr. Ulice Bold offered to take this patient to surgery tonight or first start in the morning if the hospital and the patient are amenable.  Alternatively the patient can be discharged and follow-up in the office with Dr. Ulice Bold tomorrow for further coordination of outpatient surgery.  If this is the case he should remain on no chewing diet, systemic antibiotics i.e. Augmentin and mouthwash i.e. Peridex.  If the patient is to be admitted I would recommend  admission to a primary medicine team given the severity of his polysubstance abuse and altered mental status.  I am willing to consult  in a surgical capacity if the patient can undergo an operation tomorrow first start (730am-noon; my clinical and pre-existing OR schedule does will not allow me to treat this patient surgically outside of this time window).   I communicated my ability to take care of this patient surgically to the emergency medicine team and the consultation.   I have personally spent 51 minutes involved in face-to-face and non-face-to-face activities for this patient on the day of the visit.  Professional time spent includes the following activities, in addition to those noted in the documentation: preparing to see the patient (eg, review of tests), obtaining and/or reviewing separately obtained history, performing a medically appropriate examination and/or evaluation, counseling and educating the patient/family/caregiver, ordering medications, tests or procedures, referring and communicating with other healthcare professionals, documenting clinical information in the electronic or other health record, independently interpreting results and communicating results with the patient/family/caregiver, care coordination.  Electronically signed by:  Jenetta Downer, MD  Staff Physician Facial Plastic & Reconstructive Surgery Otolaryngology - Head and Neck Surgery [facility], [place]   07/31/2022, 5:18 PM

## 2022-07-31 NOTE — ED Notes (Signed)
Patient transported to CT 

## 2022-07-31 NOTE — Assessment & Plan Note (Addendum)
Stable fractures per CT after hearing his jaw pop with jumping a fence. Notes improving pain in his jaw. Received an extra oxycodone 5 mg yesterday night. Corrective surgery scheduled today at noon.  - F/u ENT recs and surgery today - Continue IV unasyn given evidence of bleeding, possible overlying cellulitis on exam - Continue oxycodone 5 mg Q4H PRN for pain - Continue tylenol 650 mg Q6H PRN for pain - EKG pending - Continuous cardiac monitoring given recent cocaine use - NPO at midnight for planned surgery

## 2022-07-31 NOTE — ED Notes (Signed)
Pt transported to CT ?

## 2022-07-31 NOTE — ED Notes (Signed)
Pt returned from CT °

## 2022-07-31 NOTE — Assessment & Plan Note (Addendum)
Vital signs stable. Reported cocaine use before presentation to EDP. UDS pending. On exam was alert and oriented. - Monitor mental status and agitation, can consider redose ativan 0.5 mg as needed - TOC consult as below

## 2022-08-01 ENCOUNTER — Encounter (HOSPITAL_COMMUNITY): Payer: Self-pay | Admitting: Family Medicine

## 2022-08-01 ENCOUNTER — Other Ambulatory Visit: Payer: Self-pay

## 2022-08-01 LAB — COMPREHENSIVE METABOLIC PANEL
ALT: 30 U/L (ref 0–44)
AST: 41 U/L (ref 15–41)
Albumin: 3.1 g/dL — ABNORMAL LOW (ref 3.5–5.0)
Alkaline Phosphatase: 52 U/L (ref 38–126)
Anion gap: 12 (ref 5–15)
BUN: 9 mg/dL (ref 6–20)
CO2: 23 mmol/L (ref 22–32)
Calcium: 8.8 mg/dL — ABNORMAL LOW (ref 8.9–10.3)
Chloride: 102 mmol/L (ref 98–111)
Creatinine, Ser: 1.03 mg/dL (ref 0.61–1.24)
GFR, Estimated: >60 mL/min (ref >60)
Glucose, Bld: 89 mg/dL (ref 70–99)
Potassium: 3.5 mmol/L (ref 3.5–5.1)
Sodium: 137 mmol/L (ref 135–145)
Total Bilirubin: 0.8 mg/dL (ref 0.3–1.2)
Total Protein: 7.1 g/dL (ref 6.5–8.1)

## 2022-08-01 LAB — CBC
HCT: 37.7 % — ABNORMAL LOW (ref 39.0–52.0)
Hemoglobin: 12.5 g/dL — ABNORMAL LOW (ref 13.0–17.0)
MCH: 30.3 pg (ref 26.0–34.0)
MCHC: 33.2 g/dL (ref 30.0–36.0)
MCV: 91.3 fL (ref 80.0–100.0)
Platelets: 413 10*3/uL — ABNORMAL HIGH (ref 150–400)
RBC: 4.13 MIL/uL — ABNORMAL LOW (ref 4.22–5.81)
RDW: 12.9 % (ref 11.5–15.5)
WBC: 5.7 10*3/uL (ref 4.0–10.5)
nRBC: 0 % (ref 0.0–0.2)

## 2022-08-01 LAB — SURGICAL PCR SCREEN
MRSA, PCR: NEGATIVE
Staphylococcus aureus: NEGATIVE

## 2022-08-01 LAB — HIV ANTIBODY (ROUTINE TESTING W REFLEX): HIV Screen 4th Generation wRfx: NONREACTIVE

## 2022-08-01 MED ORDER — OXYCODONE HCL 5 MG/5ML PO SOLN: 5.0000 mg | ORAL | Status: DC | PRN

## 2022-08-01 MED ORDER — ENOXAPARIN SODIUM 30 MG/0.3ML IJ SOSY
30.0000 mg | PREFILLED_SYRINGE | INTRAMUSCULAR | Status: AC
  Administered 2022-08-01: 30 mg via SUBCUTANEOUS
  Filled 2022-08-01: qty 0.3

## 2022-08-01 MED ORDER — MUPIROCIN 2 % EX OINT
1.0000 | TOPICAL_OINTMENT | Freq: Two times a day (BID) | CUTANEOUS | Status: DC
  Administered 2022-08-01 (×2): 1 via NASAL
  Filled 2022-08-01: qty 22

## 2022-08-01 MED ORDER — OXYCODONE HCL 5 MG PO TABS: 5.0000 mg | ORAL_TABLET | ORAL | Status: DC | PRN

## 2022-08-01 MED ORDER — ACETAMINOPHEN 10 MG/ML IV SOLN: 1000.0000 mg | Freq: Four times a day (QID) | INTRAVENOUS | Status: DC

## 2022-08-01 MED ORDER — OXYCODONE HCL 5 MG PO TABS
5.0000 mg | ORAL_TABLET | ORAL | Status: DC | PRN
  Administered 2022-08-01 – 2022-08-03 (×4): 5 mg via ORAL
  Filled 2022-08-01 (×4): qty 1

## 2022-08-01 MED ORDER — ACETAMINOPHEN 325 MG PO TABS
650.0000 mg | ORAL_TABLET | Freq: Four times a day (QID) | ORAL | Status: DC | PRN
  Administered 2022-08-01 – 2022-08-03 (×3): 650 mg via ORAL
  Filled 2022-08-01 (×4): qty 2

## 2022-08-01 MED ORDER — OXYCODONE HCL 5 MG PO TABS
5.0000 mg | ORAL_TABLET | Freq: Once | ORAL | Status: AC
  Administered 2022-08-01: 5 mg via ORAL
  Filled 2022-08-01: qty 1

## 2022-08-01 NOTE — Progress Notes (Signed)
Daily Progress Note Intern Pager: [phone]  Patient name: Paul Campbell Medical record number: [id number] Date of birth: [birth year] Age: 31 y.o. Gender: male  Primary Care Provider: Pcp, No Consultants: ENT Code Status: Full  Pt Overview and Major Events to Date:  11/1-admitted  Assessment and Plan:  Paul Campbell is a 31 y.o. male presenting with AMS and worsening jaw pain in setting of known bilateral mandibular fracture. Worsening jaw pain likely 2/2 recent trauma while jumping a fence.   * Mandibular fracture (HCC) Stable fractures per CT after hearing his jaw pop with jumping a fence. Exam with patient holding mouth open, swelling R>L mandible with mild erythema, and mild dried blood on lips and tongue. Has not received any pain medications, now reporting jaw pain. Facial and reconstructive plastic surgeon Dr. Ernestene Kiel consulted in ED and corrective surgery scheduled for 1 PM tomorrow.  - F/u ENT recs and surgery in the morning - Continue IV unasyn given evidence of bleeding, possible overlying cellulitis on exam - Start oxycodone 5 mg Q4H PRN for pain - Start tylenol 650 mg Q6H PRN for pain - EKG - Continuous cardiac monitoring given recent cocaine use - NPO at midnight for planned surgery tomorrow  AMS (altered mental status) Vital signs stable. Given ativan 1 mg in ED for agitation. Reported cocaine use before presentation to EDP. UDS pending. CT head without acute intracranial abnormality. This morning he was fully alert and oriented - Monitor mental status and agitation, can consider redose ativan 0.5 mg as needed - TOC consult as below  Cocaine use, tobacco use Reported use today before admission. VSS. - TOC consult for substance use - F/u EKG, cardiac monitoring\ - Offer nicotine patch if desired later     FEN/GI: NPO  VTE Prophylaxis: Lovenox, holding tomorrow for surgery Dispo: Barriers include surgery.   Subjective:  Patient was seen in bed this  AM. He reports pain in his jaw due to the fracture. Otherwise, denies other concerns.  Objective: Temp:  [98 F (36.7 C)-98.8 F (37.1 C)] 98.8 F (37.1 C) (11/02 0900) Pulse Rate:  [64-101] 99 (11/02 0900) Resp:  [14-26] 19 (11/02 0900) BP: (113-164)/(52-88) 138/84 (11/02 0900) SpO2:  [97 %-100 %] 100 % (11/02 0900) FiO2 (%):  [0 %] 0 % (11/01 2000)  Physical Exam: General: Pleasant, well-appearing  in bed. No acute distress. CV: RRR. No murmurs, rubs, or gallops. No LE edema Pulmonary: Lungs CTAB. Normal effort. No wheezing or rales. Abdominal: Soft, nontender, nondistended. Normal bowel sounds. Skin: Warm and dry. No obvious rash or lesions. Neuro: A&Ox3. Moves all extremities. Normal sensation. No focal deficit. HEENT: Jaw fracture, not able to close mouth fully. Swelling R>L mandible with mild erythema  Laboratory: Most recent CBC Lab Results  Component Value Date   WBC 5.7 08/01/2022   HGB 12.5 (L) 08/01/2022   HCT 37.7 (L) 08/01/2022   MCV 91.3 08/01/2022   PLT 413 (H) 08/01/2022   Most recent BMP    Latest Ref Rng & Units 08/01/2022    3:43 AM  BMP  Glucose 70 - 99 mg/dL 89   BUN 6 - 20 mg/dL 9   Creatinine 6.26 - 9.48 mg/dL 5.46   Sodium 270 - 350 mmol/L 137   Potassium 3.5 - 5.1 mmol/L 3.5   Chloride 98 - 111 mmol/L 102   CO2 22 - 32 mmol/L 23   Calcium 8.9 - 10.3 mg/dL 8.8      Imaging/Diagnostic Tests:  CT maxillofacial and C-spine wo contrast IMPRESSION: 1. Negative for cervical spine fracture. 2. Fracture right anterior body of the mandible was present previously. There is progressive bone resorption at the fracture site. Mild displacement of the fracture. 3. Fracture left posterior body of the mandible with marked displacement, unchanged from the prior study. 4. Mild fracture right nasal bone probably chronic and unchanged. 5. Progressive mucosal edema paranasal sinuses.   CT head wo contrast IMPRESSION: No acute intracranial findings are  seen in noncontrast CT brain. Chronic sinusitis    Paul Morin, MD 08/01/2022, 12:34 PM  PGY-1, [place] Intern pager: [phone], text pages welcome Secure chat group [place]

## 2022-08-01 NOTE — Plan of Care (Signed)
Messaged Dr. Sabino Gasser regarding VTE prophylaxis and started lovenox today and holding tomorrow for surgery. Will resume lovenox on Saturday.

## 2022-08-01 NOTE — Progress Notes (Signed)
FACIAL PLASTIC SURGERY PROGRESS NOTE  ID: 31 y/o M with history of polysubstance abuse, homelessness with approximately 1 week history of bilateral open mandibular fractures, non-compliant with outpatient surgical management scheduled by Dr. Marla Roe for 07/31/22.  S: - Admitted overnight to family medicine service - OR updated - available as scheduled case tomorrow 08/02/22 ~1300 with me - patient endorsing facial pain and wants food and water  O: Resting in bed in NAD. AO X 2 (cannot recall day/week/month/year, knows self, hospital and president of the [place]). Bilateral facial edema. Left V3 numbness. Dentition fair with multiple absent dentition (Will require hybrid MMF due to dental issues). Right open mandibular fracture offest ~1cm with premature contact on the right, large left open bite ~1cm. Mucosal tear at distal molar on the left mandible. No oral pus.   A/P: 31 year old male with hx of polysubstance abuse, homelessness with approximately 1 week history of bilateral open mandibular fractures, non-compliant with outpatient surgical management scheduled by Dr. Marla Roe, who presented overnight with facial pain after using cocaine and falling off a fence. CT with stable findings of bilateral mandible fractures.  Scheduled for MMF, ORIF Bilateral mandible fractures trans-cervical approach with me 08/02/22 ~1300.  OK to eat liquid diet today Peridex QID Continue Unaysn for abx ppx NPO after midnight  I discussed surgery in in detail with the patient including the need to wire the jaw together (MMF) and bilateral neck incisions.  I discussed that if the patient does not proceed with surgery or leaves AGAINST MEDICAL ADVICE he will likely develop bilateral neck abscesses, severe infections including osteomyelitis, mandibular resorption which would lead to higher risk of long-term complications.  I discussed that in my experience patients who do not comply with recommended medical & surgical  treatment for these types of fractures may result in situations where mandibular resorption is so significant they would need a fibular free flap to rebuild their jaw.  I discussed surgery in detail including the risks/benefits/alternatives.  Risk discussed including pain, bleeding, infection (greater risk given delayed surgical treatment), scarring, numbness, lip numbness associated with the trigeminal nerve V3, facial weakness, hardware failure, malunion, nonunion, malocclusion, injury to the teeth, lips, gums, tongue, need for further procedures, risk of anesthesia.  Despite these risk the patient expressed agreement in proceeding with surgery.  Electronically signed by:  Jenetta Downer, MD  Staff Physician Facial Plastic & Reconstructive Surgery Otolaryngology - Head and Neck Surgery [place], [place]

## 2022-08-01 NOTE — Plan of Care (Signed)

## 2022-08-02 ENCOUNTER — Inpatient Hospital Stay (HOSPITAL_COMMUNITY): Payer: Self-pay | Admitting: Anesthesiology

## 2022-08-02 ENCOUNTER — Other Ambulatory Visit (HOSPITAL_COMMUNITY): Payer: Self-pay

## 2022-08-02 ENCOUNTER — Other Ambulatory Visit: Payer: Self-pay

## 2022-08-02 ENCOUNTER — Encounter (HOSPITAL_COMMUNITY): Payer: Self-pay | Admitting: Family Medicine

## 2022-08-02 ENCOUNTER — Encounter (HOSPITAL_COMMUNITY)
Admission: EM | Disposition: A | Discharge status: Home / Self Care | Payer: Self-pay | Source: Home / Self Care | Attending: Family Medicine

## 2022-08-02 DIAGNOSIS — S02652A Fracture of angle of left mandible, initial encounter for closed fracture: Secondary | ICD-10-CM

## 2022-08-02 DIAGNOSIS — S02601A Fracture of unspecified part of body of right mandible, initial encounter for closed fracture: Secondary | ICD-10-CM

## 2022-08-02 DIAGNOSIS — F17200 Nicotine dependence, unspecified, uncomplicated: Secondary | ICD-10-CM

## 2022-08-02 DIAGNOSIS — M2689 Other dentofacial anomalies: Secondary | ICD-10-CM

## 2022-08-02 DIAGNOSIS — S02600A Fracture of unspecified part of body of mandible, initial encounter for closed fracture: Secondary | ICD-10-CM

## 2022-08-02 HISTORY — PX: ORIF MANDIBULAR FRACTURE: SHX2127

## 2022-08-02 LAB — BASIC METABOLIC PANEL
Anion gap: 11 (ref 5–15)
BUN: 11 mg/dL (ref 6–20)
CO2: 25 mmol/L (ref 22–32)
Calcium: 8.8 mg/dL — ABNORMAL LOW (ref 8.9–10.3)
Chloride: 103 mmol/L (ref 98–111)
Creatinine, Ser: 0.91 mg/dL (ref 0.61–1.24)
GFR, Estimated: >60 mL/min (ref >60)
Glucose, Bld: 119 mg/dL — ABNORMAL HIGH (ref 70–99)
Potassium: 3.8 mmol/L (ref 3.5–5.1)
Sodium: 139 mmol/L (ref 135–145)

## 2022-08-02 LAB — CBC
HCT: 35.7 % — ABNORMAL LOW (ref 39.0–52.0)
Hemoglobin: 12 g/dL — ABNORMAL LOW (ref 13.0–17.0)
MCH: 30.5 pg (ref 26.0–34.0)
MCHC: 33.6 g/dL (ref 30.0–36.0)
MCV: 90.8 fL (ref 80.0–100.0)
Platelets: 427 10*3/uL — ABNORMAL HIGH (ref 150–400)
RBC: 3.93 MIL/uL — ABNORMAL LOW (ref 4.22–5.81)
RDW: 12.8 % (ref 11.5–15.5)
WBC: 7.8 10*3/uL (ref 4.0–10.5)
nRBC: 0 % (ref 0.0–0.2)

## 2022-08-02 SURGERY — OPEN REDUCTION INTERNAL FIXATION (ORIF) MANDIBULAR FRACTURE
Anesthesia: General | Site: Face | Laterality: Bilateral

## 2022-08-02 MED ORDER — HYDROCODONE-ACETAMINOPHEN 5-325 MG PO TABS
1.0000 | ORAL_TABLET | Freq: Four times a day (QID) | ORAL | 0 refills | Status: AC | PRN
  Filled 2022-08-02: qty 15, 10d supply, fill #0

## 2022-08-02 MED ORDER — FENTANYL CITRATE (PF) 250 MCG/5ML IJ SOLN
INTRAMUSCULAR | Status: DC | PRN
  Administered 2022-08-02: 100 ug via INTRAVENOUS
  Administered 2022-08-02 (×3): 50 ug via INTRAVENOUS
  Administered 2022-08-02: 100 ug via INTRAVENOUS
  Administered 2022-08-02: 50 ug via INTRAVENOUS
  Administered 2022-08-02: 100 ug via INTRAVENOUS

## 2022-08-02 MED ORDER — IBUPROFEN 600 MG PO TABS
600.0000 mg | ORAL_TABLET | Freq: Four times a day (QID) | ORAL | 1 refills | Status: AC | PRN
  Filled 2022-08-02: qty 30, 14d supply, fill #0

## 2022-08-02 MED ORDER — BACITRACIN ZINC 500 UNIT/GM EX OINT
TOPICAL_OINTMENT | CUTANEOUS | Status: AC
  Filled 2022-08-02: qty 28.35

## 2022-08-02 MED ORDER — DEXMEDETOMIDINE HCL IN NACL 200 MCG/50ML IV SOLN
INTRAVENOUS | Status: DC | PRN
  Administered 2022-08-02: 8 ug via INTRAVENOUS
  Administered 2022-08-02 (×3): 4 ug via INTRAVENOUS
  Administered 2022-08-02: 8 ug via INTRAVENOUS
  Administered 2022-08-02 (×5): 4 ug via INTRAVENOUS

## 2022-08-02 MED ORDER — ONDANSETRON HCL 4 MG/2ML IJ SOLN
INTRAMUSCULAR | Status: DC | PRN
  Administered 2022-08-02: 4 mg via INTRAVENOUS

## 2022-08-02 MED ORDER — CHLORHEXIDINE GLUCONATE 0.12 % MT SOLN
15.0000 mL | Freq: Four times a day (QID) | OROMUCOSAL | 0 refills | Status: AC
  Filled 2022-08-02: qty 473, 7d supply, fill #0

## 2022-08-02 MED ORDER — ROCURONIUM BROMIDE 10 MG/ML (PF) SYRINGE
PREFILLED_SYRINGE | INTRAVENOUS | Status: AC
  Filled 2022-08-02: qty 10

## 2022-08-02 MED ORDER — BACITRACIN ZINC 500 UNIT/GM EX OINT
TOPICAL_OINTMENT | CUTANEOUS | Status: DC | PRN
  Administered 2022-08-02: 1 via TOPICAL

## 2022-08-02 MED ORDER — MIDAZOLAM HCL 2 MG/2ML IJ SOLN
INTRAMUSCULAR | Status: AC
  Filled 2022-08-02: qty 2

## 2022-08-02 MED ORDER — LIDOCAINE 2% (20 MG/ML) 5 ML SYRINGE
INTRAMUSCULAR | Status: AC
  Filled 2022-08-02: qty 5

## 2022-08-02 MED ORDER — ACETAMINOPHEN 500 MG PO TABS
1000.0000 mg | ORAL_TABLET | Freq: Once | ORAL | Status: AC
  Administered 2022-08-02: 1000 mg via ORAL
  Filled 2022-08-02: qty 2

## 2022-08-02 MED ORDER — GLYCOPYRROLATE PF 0.2 MG/ML IJ SOSY
PREFILLED_SYRINGE | INTRAMUSCULAR | Status: AC
  Filled 2022-08-02: qty 1

## 2022-08-02 MED ORDER — MIDAZOLAM HCL 2 MG/2ML IJ SOLN
INTRAMUSCULAR | Status: DC | PRN
  Administered 2022-08-02: 2 mg via INTRAVENOUS

## 2022-08-02 MED ORDER — LIDOCAINE-EPINEPHRINE 1 %-1:100000 IJ SOLN
INTRAMUSCULAR | Status: DC | PRN
  Administered 2022-08-02: 8 mL

## 2022-08-02 MED ORDER — AMOXICILLIN-POT CLAVULANATE 875-125 MG PO TABS
1.0000 | ORAL_TABLET | Freq: Two times a day (BID) | ORAL | 0 refills | Status: AC
  Filled 2022-08-02: qty 14, 7d supply, fill #0

## 2022-08-02 MED ORDER — CHLORHEXIDINE GLUCONATE 0.12 % MT SOLN
OROMUCOSAL | Status: AC
  Filled 2022-08-02: qty 15

## 2022-08-02 MED ORDER — PROMETHAZINE HCL 25 MG/ML IJ SOLN: 6.2500 mg | INTRAMUSCULAR | Status: DC | PRN

## 2022-08-02 MED ORDER — DEXMEDETOMIDINE HCL IN NACL 80 MCG/20ML IV SOLN
INTRAVENOUS | Status: AC
  Filled 2022-08-02: qty 20

## 2022-08-02 MED ORDER — FENTANYL CITRATE (PF) 250 MCG/5ML IJ SOLN
INTRAMUSCULAR | Status: AC
  Filled 2022-08-02: qty 5

## 2022-08-02 MED ORDER — LIDOCAINE-EPINEPHRINE 1 %-1:100000 IJ SOLN
INTRAMUSCULAR | Status: AC
  Filled 2022-08-02: qty 1

## 2022-08-02 MED ORDER — DEXAMETHASONE SODIUM PHOSPHATE 10 MG/ML IJ SOLN
INTRAMUSCULAR | Status: AC
  Filled 2022-08-02: qty 1

## 2022-08-02 MED ORDER — FENTANYL CITRATE (PF) 100 MCG/2ML IJ SOLN
INTRAMUSCULAR | Status: AC
  Filled 2022-08-02: qty 2

## 2022-08-02 MED ORDER — KETAMINE HCL 50 MG/5ML IJ SOSY
PREFILLED_SYRINGE | INTRAMUSCULAR | Status: AC
  Filled 2022-08-02: qty 5

## 2022-08-02 MED ORDER — FENTANYL CITRATE (PF) 100 MCG/2ML IJ SOLN
25.0000 ug | INTRAMUSCULAR | Status: DC | PRN
  Administered 2022-08-02: 25 ug via INTRAVENOUS

## 2022-08-02 MED ORDER — ONDANSETRON HCL 4 MG/2ML IJ SOLN
INTRAMUSCULAR | Status: AC
  Filled 2022-08-02: qty 2

## 2022-08-02 MED ORDER — GLYCOPYRROLATE PF 0.2 MG/ML IJ SOSY
PREFILLED_SYRINGE | INTRAMUSCULAR | Status: DC | PRN
  Administered 2022-08-02 (×2): .1 mg via INTRAVENOUS

## 2022-08-02 MED ORDER — SUGAMMADEX SODIUM 200 MG/2ML IV SOLN
INTRAVENOUS | Status: DC | PRN
  Administered 2022-08-02: 200 mg via INTRAVENOUS

## 2022-08-02 MED ORDER — ROCURONIUM BROMIDE 10 MG/ML (PF) SYRINGE
PREFILLED_SYRINGE | INTRAVENOUS | Status: DC | PRN
  Administered 2022-08-02: 40 mg via INTRAVENOUS
  Administered 2022-08-02: 60 mg via INTRAVENOUS
  Administered 2022-08-02 (×2): 20 mg via INTRAVENOUS

## 2022-08-02 MED ORDER — 0.9 % SODIUM CHLORIDE (POUR BTL) OPTIME
TOPICAL | Status: DC | PRN
  Administered 2022-08-02: 1000 mL

## 2022-08-02 MED ORDER — DEXAMETHASONE SODIUM PHOSPHATE 10 MG/ML IJ SOLN
INTRAMUSCULAR | Status: DC | PRN
  Administered 2022-08-02: 10 mg via INTRAVENOUS

## 2022-08-02 MED ORDER — OXYMETAZOLINE HCL 0.05 % NA SOLN
NASAL | Status: AC
  Filled 2022-08-02: qty 30

## 2022-08-02 MED ORDER — LACTATED RINGERS IV SOLN: INTRAVENOUS | Status: DC

## 2022-08-02 MED ORDER — PROPOFOL 10 MG/ML IV BOLUS
INTRAVENOUS | Status: DC | PRN
  Administered 2022-08-02: 170 mg via INTRAVENOUS

## 2022-08-02 MED ORDER — LIDOCAINE 2% (20 MG/ML) 5 ML SYRINGE
INTRAMUSCULAR | Status: DC | PRN
  Administered 2022-08-02: 40 mg via INTRAVENOUS

## 2022-08-02 SURGICAL SUPPLY — 89 items
BAND DENTAL 3/16IN PULL HEAVY (MISCELLANEOUS) IMPLANT
BAR FIX PREFORMED OMNIMAX (Miscellaneous) IMPLANT
BIT DRILL 1.5X50 7MMSTP W/NTCH (BIT) IMPLANT
BIT DRILL STOP 1.8X50X26 (MISCELLANEOUS) IMPLANT
BLADE MANDIBULAR SCREWDRIVER (BLADE) IMPLANT
BLADE SURG 15 STRL LF DISP TIS (BLADE) IMPLANT
BLADE SURG 15 STRL SS (BLADE) ×1
CANISTER SUCT 3000ML PPV (MISCELLANEOUS) ×1 IMPLANT
CLEANER TIP ELECTROSURG 2X2 (MISCELLANEOUS) ×1 IMPLANT
CLIP TI MEDIUM 6 (CLIP) IMPLANT
CLIP TI WIDE RED SMALL 24 (CLIP) IMPLANT
CLIP TI WIDE RED SMALL 6 (CLIP) IMPLANT
CORD BIPOLAR FORCEPS 12FT (ELECTRODE) IMPLANT
COVER SURGICAL LIGHT HANDLE (MISCELLANEOUS) IMPLANT
DRAIN CHANNEL 19F RND (DRAIN) IMPLANT
DRAPE HALF SHEET 40X57 (DRAPES) ×1 IMPLANT
DRAPE UTILITY XL STRL (DRAPES) IMPLANT
DRILL 1.5X50 7MM STP W/NOTCH (BIT) ×1
DRILL STOP 1.8X50X26 (MISCELLANEOUS) ×1
ELECT COATED BLADE 2.86 ST (ELECTRODE) ×1 IMPLANT
ELECT NDL BLADE 2-5/6 (NEEDLE) ×1 IMPLANT
ELECT NDL TIP 2.8 STRL (NEEDLE) IMPLANT
ELECT NEEDLE BLADE 2-5/6 (NEEDLE) IMPLANT
ELECT NEEDLE TIP 2.8 STRL (NEEDLE) IMPLANT
ELECT REM PT RETURN 9FT ADLT (ELECTROSURGICAL) ×1
ELECTRODE REM PT RTRN 9FT ADLT (ELECTROSURGICAL) ×1 IMPLANT
EVACUATOR SILICONE 100CC (DRAIN) IMPLANT
GAUZE 4X4 16PLY ~~LOC~~+RFID DBL (SPONGE) IMPLANT
GLOVE BIO SURGEON STRL SZ7.5 (GLOVE) ×1 IMPLANT
GLOVE BIOGEL PI IND STRL 8 (GLOVE) IMPLANT
GLOVE INDICATOR 8.0 STRL GRN (GLOVE) ×1 IMPLANT
GOWN STRL REUS W/ TWL LRG LVL3 (GOWN DISPOSABLE) ×2 IMPLANT
GOWN STRL REUS W/ TWL XL LVL3 (GOWN DISPOSABLE) ×1 IMPLANT
GOWN STRL REUS W/TWL LRG LVL3 (GOWN DISPOSABLE) ×2
GOWN STRL REUS W/TWL XL LVL3 (GOWN DISPOSABLE) ×1
KIT BASIN OR (CUSTOM PROCEDURE TRAY) ×1 IMPLANT
KIT TURNOVER KIT B (KITS) ×1 IMPLANT
NDL 27GX1/2 REG BEVEL ECLIP (NEEDLE) IMPLANT
NDL HYPO 25GX1X1/2 BEV (NEEDLE) IMPLANT
NEEDLE 27GX1/2 REG BEVEL ECLIP (NEEDLE) ×1 IMPLANT
NEEDLE HYPO 25GX1X1/2 BEV (NEEDLE) IMPLANT
NS IRRIG 1000ML POUR BTL (IV SOLUTION) ×1 IMPLANT
PAD ARMBOARD 7.5X6 YLW CONV (MISCELLANEOUS) ×2 IMPLANT
PATTIES SURGICAL .5 X3 (DISPOSABLE) IMPLANT
PENCIL SMOKE EVACUATOR (MISCELLANEOUS) ×1 IMPLANT
PLATE 7H STRAIGHT 1.0MM SILVER (Plate) IMPLANT
PLATE LOCK 12H STR 2.0 BLUE (Plate) IMPLANT
PLATE LOCK SML ANGLE 1.6 GOLD (Plate) IMPLANT
PLATE LOCK STRT 4H 1/SCREW (Plate) IMPLANT
SCISSORS WIRE ANG 4 3/4 DISP (INSTRUMENTS) ×1 IMPLANT
SCREW BONE 2X7 CROSS DRIVE (Screw) IMPLANT
SCREW LOCKING X-DR 2.0X6 (Screw) IMPLANT
SCREW LOCKING X-DR 2.0X8 (Screw) IMPLANT
SCREW LOCKING X-DR 2.3X10 (Screw) IMPLANT
SCREW LOCKING X-DR 2.3X12 (Screw) IMPLANT
SCREW LOCKING X-DR 2.3X14 (Screw) IMPLANT
SCREW LOCKING X-DR 2.3X16 (Screw) IMPLANT
SCREW LOCKING X-DR 2.3X18 (Screw) IMPLANT
SCREW LOCKING X-DR 2.3X8 (Screw) IMPLANT
SCREW NON LOCK X-DR 2.0X5 (Screw) IMPLANT
SCREW NON LOCK X-DR 2.0X6 (Screw) IMPLANT
SET WALTER ACTIVATION W/DRAPE (SET/KITS/TRAYS/PACK) IMPLANT
STAPLER VISISTAT 35W (STAPLE) ×1 IMPLANT
SUT BONE WAX W31G (SUTURE) IMPLANT
SUT CHROMIC 3 0 SH 27 (SUTURE) IMPLANT
SUT CHROMIC 4 0 PS 2 18 (SUTURE) IMPLANT
SUT ETHILON 3 0 PS 1 (SUTURE) IMPLANT
SUT PLAIN GUT FAST 5-0 (SUTURE) IMPLANT
SUT SILK 3 0 (SUTURE)
SUT SILK 3 0 REEL (SUTURE) IMPLANT
SUT SILK 3 0 SH 30 (SUTURE) IMPLANT
SUT SILK 3 0SH CR/8 30 (SUTURE) IMPLANT
SUT SILK 3-0 18XBRD TIE 12 (SUTURE) IMPLANT
SUT STEEL 0 (SUTURE)
SUT STEEL 0 18XMFL TIE 17 (SUTURE) IMPLANT
SUT STEEL 2 (SUTURE) IMPLANT
SUT VIC AB 3-0 54X BRD REEL (SUTURE) IMPLANT
SUT VIC AB 3-0 BRD 54 (SUTURE)
SUT VIC AB 3-0 FS2 27 (SUTURE) IMPLANT
SUT VIC AB 3-0 SH 8-18 (SUTURE) IMPLANT
SUT VIC AB 4-0 P-3 18X BRD (SUTURE) IMPLANT
SUT VIC AB 4-0 P3 18 (SUTURE)
SUT VIC AB 4-0 RB1 18 (SUTURE) IMPLANT
SUT VICRYL 3-0 RB1 18 ABS (SUTURE) IMPLANT
TOWEL GREEN STERILE FF (TOWEL DISPOSABLE) ×1 IMPLANT
TRAY ENT MC OR (CUSTOM PROCEDURE TRAY) ×1 IMPLANT
TUBE CONNECTING 12X1/4 (SUCTIONS) IMPLANT
WATER STERILE IRR 1000ML POUR (IV SOLUTION) ×1 IMPLANT
WIRE 24 GAUGE OMINIMAX MMF (WIRE) IMPLANT

## 2022-08-02 NOTE — TOC Transition Note (Signed)
Discharge medications (4) are being stored in the main pharmacy on the ground floor until patient is ready for discharge.   

## 2022-08-02 NOTE — Progress Notes (Signed)
FMTS Interim Progress Note  S:Evaluated patient post op. He endorses feeling pain and then covered his face with a blanket, as he was not interested in talking.  O: BP (!) 148/76 (BP Location: Right Arm)   Pulse 66   Temp 99.5 F (37.5 C)   Resp 14   SpO2 96%   General: Alert, in NAD Skin: Warm, dry HEENT: NCAT, EOM grossly normal, midline nasal septum, bilateral swollen mandibles, patient holding mouth closed though does verbalize some words Cardiac: RRR, no m/r/g appreciated Respiratory: CTAB anteriorly, breathing and speaking comfortably on RA Extremities: Moves all extremities grossly equally in bed Neurological: No gross focal deficit Psychiatric: Frustrated mood, flat affect  A/P: Mandibular fracture Stable post op with expected pain and exam. - ENT following, appreciate recs - Continue tylenol 650 mg Q6H prn and oxy 5 mg Q4H prn pain, can give additional doses of oxy should pain levels remained uncontrolled  Remainder per day team note.  Ethelene Hal, MD 08/02/2022, 10:08 PM PGY-1, [place] Medicine Service pager [phone]

## 2022-08-02 NOTE — Anesthesia Preprocedure Evaluation (Addendum)
Anesthesia Evaluation  Patient identified by MRN, date of birth, ID band Patient awake    Reviewed: Allergy & Precautions, NPO status , Patient's Chart, lab work & pertinent test results  Airway Mallampati: III  TM Distance: >3 FB Neck ROM: Full  Mouth opening: Limited Mouth Opening  Dental  (+) Dental Advisory Given, Missing, Poor Dentition, Loose   Pulmonary Current Smoker and Patient abstained from smoking.   Pulmonary exam normal breath sounds clear to auscultation       Cardiovascular negative cardio ROS Normal cardiovascular exam Rhythm:Regular Rate:Normal     Neuro/Psych  PSYCHIATRIC DISORDERS      negative neurological ROS     GI/Hepatic negative GI ROS,,,(+)     substance abuse  cocaine use  Endo/Other  negative endocrine ROS    Renal/GU negative Renal ROS     Musculoskeletal negative musculoskeletal ROS (+)    Abdominal   Peds  Hematology negative hematology ROS (+)   Anesthesia Other Findings Day of surgery medications reviewed with the patient.   Bilateral Mandible fractures  Reproductive/Obstetrics                             Anesthesia Physical Anesthesia Plan  ASA: 3  Anesthesia Plan: General   Post-op Pain Management: Tylenol PO (pre-op)*   Induction: Intravenous  PONV Risk Score and Plan: 3 and Midazolam, Dexamethasone and Ondansetron  Airway Management Planned: Nasal ETT  Additional Equipment:   Intra-op Plan:   Post-operative Plan: Extubation in OR  Informed Consent: I have reviewed the patients History and Physical, chart, labs and discussed the procedure including the risks, benefits and alternatives for the proposed anesthesia with the patient or authorized representative who has indicated his/her understanding and acceptance.     Dental advisory given  Plan Discussed with: CRNA  Anesthesia Plan Comments:        Anesthesia Quick  Evaluation

## 2022-08-02 NOTE — Brief Op Note (Signed)
FACIAL PLASTIC SURGERY OPERATIVE NOTE 08/02/2022  5:43 PM  PATIENT:  Paul Campbell  31 y.o. male  PRE-OPERATIVE DIAGNOSIS:  Bilateral Mandible fractures  POST-OPERATIVE DIAGNOSIS:  Bilateral Mandible fractures  PROCEDURE:   Open reduction internal fixation of right mandibular body fracture - transcervical approach (CPT [phone]) Open reduction internal fixation of left mandibular angle fracture - transcervical approach (CPT [phone]) Maxillomandibular fixation (CPT [phone])  FINDINGS: - Severely displaced and comminuted right mandibular body fracture with basal triangle comminution; reduced with 1.0 7 hole simplification plate with 3 monocortical screws and with 2.12mm 10-hole load bearing plate with 7 transosteal screws and single fixation screw for basal triangle segment - Severely displaced left mandibular angle fracture repaired with 1.13mm 4-hole simplification plate and 0.3UU 7-hole Curvilinear angle plate with 6 transosteal screws - Hybrid arch bars achieved normal occlusion ; all removed at end of procedure; release of MMF demonstrated normal maxillomandibular occlusion - Multiple absent dentition  SURGEON:  Surgeon(s) and Role:    * Jenetta Downer, MD - Primary  PHYSICIAN ASSISTANT: none  ASSISTANTS: Celene Squibb RN First Assist   ANESTHESIA:   local and general  EBL:  100 mL   BLOOD ADMINISTERED:none  DRAINS: (19 FR) Jackson-Pratt drain(s) with closed bulb suction in the left neck    LOCAL MEDICATIONS USED:  OTHER Lidocaine with epinephrine 10 mL  SPECIMEN:  No Specimen  DISPOSITION OF SPECIMEN:  N/A  COUNTS:  YES  TOURNIQUET:  * No tourniquets in log *  DICTATION: .Note written in EPIC  PLAN OF CARE:  Return to primary medicine service; anticipate discharge in AM after drain removal  PATIENT DISPOSITION:  PACU - hemodynamically stable.   Electronically signed by:  Jenetta Downer, MD  Staff Physician Facial Plastic & Reconstructive Surgery Otolaryngology  - Head and Neck Surgery [place], [place]

## 2022-08-02 NOTE — Anesthesia Procedure Notes (Signed)
Procedure Name: Intubation Date/Time: 08/02/2022 1:10 PM  Performed by: Cathren Harsh, CRNAPre-anesthesia Checklist: Patient identified, Emergency Drugs available, Suction available and Patient being monitored Patient Re-evaluated:Patient Re-evaluated prior to induction Oxygen Delivery Method: Circle System Utilized Preoxygenation: Pre-oxygenation with 100% oxygen Induction Type: IV induction Ventilation: Mask ventilation without difficulty Laryngoscope Size: Glidescope and 4 Grade View: Grade I Nasal Tubes: Nasal Rae, Nasal prep performed and Left Tube size: 6.5 mm Number of attempts: 1 Placement Confirmation: ETT inserted through vocal cords under direct vision, positive ETCO2 and breath sounds checked- equal and bilateral Secured at: 27 cm Tube secured with: Tape Dental Injury: Teeth and Oropharynx as per pre-operative assessment

## 2022-08-02 NOTE — Transfer of Care (Signed)
Immediate Anesthesia Transfer of Care Note  Patient: Paul Campbell  Procedure(s) Performed: MAXILLOMANDIBULAR FIXATION, OPEN REDUCTION INTERNAL FIXATION BILATERAL MANDIBLE FRACTURES (Bilateral: Face)  Patient Location: General  Anesthesia Type:General  Level of Consciousness: awake and drowsy  Airway & Oxygen Therapy: Patient Spontanous Breathing  Post-op Assessment: Report given to RN and Post -op Vital signs reviewed and stable  Post vital signs: Reviewed and stable  Last Vitals:  Vitals Value Taken Time  BP 125/92   Temp    Pulse 95 08/02/22 1806  Resp 12 08/02/22 1806  SpO2 97 % 08/02/22 1806  Vitals shown include unvalidated device data.  Last Pain:  Vitals:   08/02/22 1219  TempSrc:   PainSc: 7       Patients Stated Pain Goal: 2 (33/74/45 1460)  Complications: No notable events documented.

## 2022-08-02 NOTE — Discharge Instructions (Signed)
Jaw Fracture Instructions:  1. Limited activity 2. Liquid diet only 3. May bathe and shower 4. Saline nasal spray - 4 puffs/nostril 4 times daily 5. Elevate Head of Bed 6. Ice compress to jaw 7. Rinse mouth with water twice daily 8. Motrin Gel Caps (3 200mg  caps) three times daily for 10 days 9. Keep wire cutters on hand at [facility], only cut wires if vomiting  Contact our office immediately if wires are cut  [facility] [phone]   Fractured Jaw Diet - NO CHEWING FOOD X 6 WEEKS! This diet should be used after jaw or mouth surgery, wired jaw surgery, or dental surgery. The consistency of foods in this diet should be thin enough to be sipped from a straw or given through a syringe. It is important to consume enough calories and protein to prevent weight loss and to promote healing after surgery. You will need to have 3 meals and 3 snacks daily. It is important to eat from a variety of food groups. Foods you normally eat may be blended to the correct texture. INSTRUCTIONS FOR BLENDING FOODS Prepare foods by removing skins, seeds, and peels.  Cook meats and vegetables thoroughly. Avoid using raw eggs. Powdered or pasteurized egg mixtures may be used.  Cut foods into small pieces and mix with a small amount of liquid in a food processor or blender. Continue to add liquids until foods become thin enough to sip through a straw.  Add juice, milk, cream, broth, gravy, or vegetable juice to add flavor and to thin foods.  Blending foods sometimes causes air bubbles that may not be desirable. Heating foods after blending will reduce the foam produced from blending.  TIPS If you are losing weight, you may need to add extra calories to your food by:  Adding powdered milk or protein powder to food.  Adding extra fats, such as tub margarine, sour cream, cream cheese, cream, nut butters (such as peanut butter or almond butter), and half-and-half.  Adding sweets, such as honey, ice cream, black strap  molasses, or sugar.  Your teeth and mouth may be sensitive to extreme temperatures. Heat or cool your foods only to lukewarm or cool temperatures.  Stock Conservator, museum/gallery with a variety of liquid nutritional supplement drinks.  Take a liquid multivitamin to ensure you are getting adequate nutrients.  Use baby food if you are short on time or energy.  FOOD CHOICES ALL FOODS MUST BE BLENDED. Starches 4 servings or more Hot cereals, such as oatmeal, grits, ground wheat cereals, and polenta.  Mashed potatoes.  Vegetables 3 servings or more All vegetables and vegetable juices.  Fruit 2 servings or more All fruits and fruit juices.  Meat and Meat Substitutes 2 servings or more (6 oz per day) Soft-boiled eggs, scrambled eggs, cottage cheese, cheese sauce.  Ground meats, such as hamburger, [place], sausage, meatloaf.  Custard, baby food meat.  Milk 2 cups or equivalent Liquid, powdered, or evaporated milk.  Buttermilk or chocolate milk.  Drinkable yogurt.  [place], ice cream, milkshakes, eggnog, pudding, and liquid supplements.  Beverages Coffee (regular or decaffeinated), tea, and mineral water.  Liquid supplements.  Condiments All seasonings and condiments that blend well.   Document Released: 03/06/2010 Document Revised: 05/29/2011 Document Reviewed: 03/06/2010 [facility] Patient Information 2012 [place].  Follow up with Dr. Sabino Gasser in 1 week FRIDAY November 10th, 2023, 2:00PM   [facility] [address], [address]  [place], [place] [address] Phone: [phone]  Dear Paul Campbell,  Thank you for letting us participate in your care. You were hospitalized for worsening jaw pain and diagnosed with Mandibular fracture ([place]). You were treated with surgery.   POST-HOSPITAL & CARE INSTRUCTIONS See post care instructions above from your surgeon. Go to your follow up appointments (listed  below)  DOCTOR'S APPOINTMENT   No future appointments.   Take care and be well!  [facility]  [place], [place] [address] [phone]

## 2022-08-02 NOTE — Progress Notes (Signed)
Daily Progress Note Intern Pager: [phone]  Patient name: Paul Campbell Medical record number: [id number] Date of birth: 1991-09-20 Age: 31 y.o. Gender: male  Primary Care Provider: Pcp, No Consultants: ENT Code Status: Full  Pt Overview and Major Events to Date:  11/1-admitted   Assessment and Plan:  Paul Campbell is a 31 y.o. male presenting with AMS and worsening jaw pain in setting of known bilateral mandibular fracture. Worsening jaw pain likely 2/2 recent trauma while jumping a fence.    * Mandibular fracture (HCC) Stable fractures per CT after hearing his jaw pop with jumping a fence. Notes improving pain in his jaw. Received an extra oxycodone 5 mg yesterday night. Corrective surgery scheduled today at noon.  - F/u ENT recs and surgery today - Continue IV unasyn given evidence of bleeding, possible overlying cellulitis on exam - Continue oxycodone 5 mg Q4H PRN for pain - Continue tylenol 650 mg Q6H PRN for pain - EKG pending - Continuous cardiac monitoring given recent cocaine use - NPO at midnight for planned surgery   AMS (altered mental status) Vital signs stable. Reported cocaine use before presentation to EDP. UDS pending. On exam was alert and oriented. - Monitor mental status and agitation, can consider redose ativan 0.5 mg as needed - TOC consult as below  Cocaine use, tobacco use Reported use today before admission. VSS. - TOC consult for substance use - F/u EKG, cardiac monitoring\ - Offer nicotine patch if desired later     FEN/GI: NPO  VTE Prophylaxis: Lovenox, holding today for surgery Dispo: Barriers include surgery.   Subjective:  Patient was seen this AM in bed. He reports the pain medications are slightly helping. He was stable this morning,   Objective: Temp:  [97.7 F (36.5 C)-98.6 F (37 C)] 98.6 F (37 C) (11/03 0827) Pulse Rate:  [61-87] 61 (11/03 1219) Resp:  [19-20] 20 (11/03 1219) BP: (111-132)/(56-70) 132/70 (11/03  1219) SpO2:  [99 %] 99 % (11/03 1219)  Physical Exam: General: Pleasant, well-appearing in bed. No acute distress. CV: RRR. No murmurs, rubs, or gallops. No LE edema Pulmonary: Lungs CTAB. Normal effort. No wheezing or rales. Skin: Warm and dry. No obvious rash or lesions. Neuro: A&Ox3. Moves all extremities. Normal sensation. No focal deficit. HEENT: Jaw fracture, not not able to fully close mouth. Swelling R>L mandible, improved erythema   Laboratory: Most recent CBC Lab Results  Component Value Date   WBC 7.8 08/02/2022   HGB 12.0 (L) 08/02/2022   HCT 35.7 (L) 08/02/2022   MCV 90.8 08/02/2022   PLT 427 (H) 08/02/2022   Most recent BMP    Latest Ref Rng & Units 08/02/2022    3:14 AM  BMP  Glucose 70 - 99 mg/dL 732   BUN 6 - 20 mg/dL 11   Creatinine 2.02 - 1.24 mg/dL 5.42   Sodium 706 - 237 mmol/L 139   Potassium 3.5 - 5.1 mmol/L 3.8   Chloride 98 - 111 mmol/L 103   CO2 22 - 32 mmol/L 25   Calcium 8.9 - 10.3 mg/dL 8.8      Imaging/Diagnostic Tests:  CT maxillofacial and C-spine wo contrast IMPRESSION: 1. Negative for cervical spine fracture. 2. Fracture right anterior body of the mandible was present previously. There is progressive bone resorption at the fracture site. Mild displacement of the fracture. 3. Fracture left posterior body of the mandible with marked displacement, unchanged from the prior study. 4. Mild fracture right nasal bone  probably chronic and unchanged. 5. Progressive mucosal edema paranasal sinuses.   CT head wo contrast IMPRESSION: No acute intracranial findings are seen in noncontrast CT brain. Chronic sinusitis   Alesia Morin, MD 08/02/2022, 1:13 PM  PGY-1, [place] Intern pager: [phone], text pages welcome Secure chat group [place]

## 2022-08-03 DIAGNOSIS — F1994 Other psychoactive substance use, unspecified with psychoactive substance-induced mood disorder: Secondary | ICD-10-CM

## 2022-08-03 DIAGNOSIS — Z720 Tobacco use: Secondary | ICD-10-CM

## 2022-08-03 LAB — CBC
HCT: 33.3 % — ABNORMAL LOW (ref 39.0–52.0)
Hemoglobin: 11.1 g/dL — ABNORMAL LOW (ref 13.0–17.0)
MCH: 30.3 pg (ref 26.0–34.0)
MCHC: 33.3 g/dL (ref 30.0–36.0)
MCV: 91 fL (ref 80.0–100.0)
Platelets: 453 10*3/uL — ABNORMAL HIGH (ref 150–400)
RBC: 3.66 MIL/uL — ABNORMAL LOW (ref 4.22–5.81)
RDW: 13 % (ref 11.5–15.5)
WBC: 10.2 10*3/uL (ref 4.0–10.5)
nRBC: 0 % (ref 0.0–0.2)

## 2022-08-03 NOTE — Progress Notes (Signed)
ENT PROGRESS NOTE   Subjective: Patient seen and examined at bedside. No issues overnight. States he is tolerating pureed diet, no nausea, no emesis. Pain controlled.   Objective: Vital signs in last 24 hours: Temp:  [98.9 F (37.2 C)-100.4 F (38 C)] 98.9 F (37.2 C) (11/04 0755) Pulse Rate:  [61-94] 72 (11/04 0755) Resp:  [13-20] 19 (11/04 0755) BP: (129-148)/(70-88) 134/79 (11/04 0755) SpO2:  [95 %-99 %] 99 % (11/04 0755) Weight:  [81.6 kg] 81.6 kg (11/04 0325)  CONSTITUTIONAL: well developed, nourished, no distress and alert and oriented x 3 PULMONARY/CHEST WALL: effort normal and no stridor HENT: Head : normocephalic and atraumatic Oral cavity: Normal occlusion noted, mild trismus present   NECK: Full apron incision clean, dry, intact. Moderate postoperative perimandibular edema noted, no evidence of seroma or hematoma. Drain exiting left neck with scant serosanguinous drainage.   Recent Labs    08/01/22 0343 08/02/22 0314  NA 137 139  K 3.5 3.8  CL 102 103  CO2 23 25  GLUCOSE 89 119*  BUN 9 11  CREATININE 1.03 0.91  CALCIUM 8.8* 8.8*    Medications: I have reviewed the patient's current medications.  New Imaging: None  Assessment/Plan:  31 year old male with history of polysubstance abuse with bilateral mandibular fractures, now s/p transcervical ORIF with Dr. Sabino Gasser on 11/03 -Drain removed without issue -Patient tolerating soft/pureed diet without issue. Reports pain controlled -Stable for discharge from ENT perspective. Patient has FU appointment with Dr. Sabino Gasser in 1 week    LOS: 2 days    Paul Campbell, [facility] 08/03/2022, 11:43 AM

## 2022-08-03 NOTE — Anesthesia Postprocedure Evaluation (Signed)
Anesthesia Post Note  Patient: Paul Campbell  Procedure(s) Performed: MAXILLOMANDIBULAR FIXATION, OPEN REDUCTION INTERNAL FIXATION BILATERAL MANDIBLE FRACTURES (Bilateral: Face)     Patient location during evaluation: PACU Anesthesia Type: General Level of consciousness: awake and alert Pain management: pain level controlled Vital Signs Assessment: post-procedure vital signs reviewed and stable Respiratory status: spontaneous breathing, nonlabored ventilation, respiratory function stable and patient connected to nasal cannula oxygen Cardiovascular status: blood pressure returned to baseline and stable Postop Assessment: no apparent nausea or vomiting Anesthetic complications: no   No notable events documented.              Effie Berkshire

## 2022-08-03 NOTE — Progress Notes (Signed)
Meds from [facility] given to and signed for by pt.   Irven Baltimore, RN

## 2022-08-03 NOTE — Progress Notes (Signed)
     Daily Progress Note Intern Pager: [phone]  Patient name: Paul Campbell Medical record number: [id number] Date of birth: 02-13-1991 Age: 31 y.o. Gender: male  Primary Care Provider: Pcp, No Consultants: ENT Code Status: Full   Pt Overview and Major Events to Date:  11/1-admitted   Assessment and Plan:   Paul Campbell is a 31 y.o. male presenting with AMS and worsening jaw pain in setting of known bilateral mandibular fracture. He is now s/p corrective surgery.  * Mandibular fracture (HCC) Corrective surgery completed yesterday. Patient tolerating post op period well with oxycodone for pain control. He is on full liquid diet. - Continue IV unasyn given evidence of bleeding, possible overlying cellulitis on exam for now pending ENT recs - Continue oxycodone 5 mg Q4H PRN, tylenol 650 mg Q6H PRN for pain - Continuous cardiac monitoring given recent cocaine use  AMS (altered mental status) Vital signs stable. Appears resolved with waning effects of cocaine and ativan in ED. Reported cocaine use before presentation to EDP. - Monitor mental status and agitation - TOC consult as below  Cocaine use, tobacco use Reported use today before admission. VSS. - TOC consult for substance use - Offer nicotine patch if desired later   FEN/GI: NPO  VTE Prophylaxis: Lovenox, holding today for surgery Dispo: Discharge likely today pending TOC for PCP/housing needs  Subjective:  Still in pain this morning at 10/10. Has not gotten much rest. Is frustrated because as soon as he gets to sleep, someone comes in and wakes him up. He does not want to talk right now.  Objective: Temp:  [98.6 F (37 C)-100.4 F (38 C)] 99.1 F (37.3 C) (11/04 0325) Pulse Rate:  [61-94] 73 (11/04 0325) Resp:  [13-20] 16 (11/04 0325) BP: (111-148)/(69-88) 129/88 (11/04 0325) SpO2:  [95 %-99 %] 99 % (11/03 2324) Weight:  [81.6 kg] 81.6 kg (11/04 0325) Physical Exam: General: Alert and oriented, in NAD,  speaking with eyes closed and comfortable appearing, declines exam HEENT: Bilateral swollen mandibles, speaking well in full sentences Cardiac: Regular rate on monitor Respiratory: Breathing and speaking comfortably on RA Extremities: Moves all extremities grossly equally in bed Neurological: No gross focal deficit Psychiatric: Frustrated mood  Laboratory: Most recent CBC Lab Results  Component Value Date   WBC 7.8 08/02/2022   HGB 12.0 (L) 08/02/2022   HCT 35.7 (L) 08/02/2022   MCV 90.8 08/02/2022   PLT 427 (H) 08/02/2022   Most recent BMP    Latest Ref Rng & Units 08/02/2022    3:14 AM  BMP  Glucose 70 - 99 mg/dL 119   BUN 6 - 20 mg/dL 11   Creatinine 0.61 - 1.24 mg/dL 0.91   Sodium 135 - 145 mmol/L 139   Potassium 3.5 - 5.1 mmol/L 3.8   Chloride 98 - 111 mmol/L 103   CO2 22 - 32 mmol/L 25   Calcium 8.9 - 10.3 mg/dL 8.8    Ethelene Hal, MD 08/03/2022, 5:46 AM PGY-1, [place] Intern pager: [phone], text pages welcome Secure chat group [place]

## 2022-08-03 NOTE — Discharge Summary (Addendum)
[facility] Discharge Summary  Patient name: Paul Campbell Medical record number: [id number] Date of birth: May 16, 1991 Age: 31 y.o. Gender: male Date of Admission: 07/31/2022  Date of Discharge: 08/03/2022  Admitting Physician: Leeanne Rio, MD  Primary Care Provider: Pcp, No Consultants: ENT  Indication for Hospitalization: Bilateral mandibular fracture  Brief Hospital Course:  Paul Campbell is a 31 y.o. male who presented with AMS and worsening jaw pain in setting of known bilateral mandibular fracture. PMH significant for substance induced mood disorder, cocaine use, and tobacco use. A brief hospital course is below.   Mandibular fracture ([place]) Presented with increased mandibular pain after hitting his jaw while jumping a fence. Had been seen on 10/26 and planned for o/p surgery on day of presentation. On admission, mouth was held open, R>L mandible swelling with mild erythema, and mild dried blood on lips and tongue. CT with stable fractures. He was placed on IV unasyn given bleeding and possible overlying cellulitis. Facial and reconstructive plastic surgeon Dr. Sabino Gasser consulted in ED and performed corrective surgery.  He was discharged home with Augmentin 875 twice daily, pain was controlled on oxy 5mg  q6h PRN.   AMS (altered mental status) Presented with agitation after cocaine use and given 1 mg ativan. CT head without acute intracranial abnormality. His mental status improved with waning effects of cocaine and ativan.  Cocaine use, tobacco use Reported use on admission. TOC consulted for substance use.  Discharge Diagnoses/Problem List:  Present on Admission:  Mandibular fracture [facility])   Disposition: home  Discharge Condition: Stable  Discharge Exam: Blood pressure 134/79, pulse 72, temperature 98.9 F (37.2 C), temperature source Axillary, resp. rate 19, height 5\' 7"  (1.702 m), weight 81.6 kg, SpO2 99 %.  General: Alert and  oriented, in NAD, speaking with eyes closed and comfortable appearing, declines exam HEENT: Bilateral swollen mandibles, speaking well in full sentences Cardiac: Regular rate on monitor Respiratory: Breathing and speaking comfortably on RA Extremities: Moves all extremities grossly equally in bed Neurological: No gross focal deficit Psychiatric: Frustrated mood  Significant Procedures:  Open reduction internal fixation of right mandibular body fracture - transcervical approach (CPT [phone]) Open reduction internal fixation of left mandibular angle fracture - transcervical approach (CPT [phone]) Maxillomandibular fixation (CPT 29528)  Significant Labs and Imaging:  Recent Labs  Lab 08/02/22 0314 08/03/22 0921  WBC 7.8 10.2  HGB 12.0* 11.1*  HCT 35.7* 33.3*  PLT 427* 453*   Recent Labs  Lab 08/02/22 0314  NA 139  K 3.8  CL 103  CO2 25  GLUCOSE 119*  BUN 11  CREATININE 0.91  CALCIUM 8.8*   CT head wo contrast IMPRESSION: No acute intracranial findings are seen in noncontrast CT brain. Chronic sinusitis.  CT maxillofacial wo contrast IMPRESSION: 1. Negative for cervical spine fracture. 2. Fracture right anterior body of the mandible was present previously. There is progressive bone resorption at the fracture site. Mild displacement of the fracture. 3. Fracture left posterior body of the mandible with marked displacement, unchanged from the prior study. 4. Mild fracture right nasal bone probably chronic and unchanged. 5. Progressive mucosal edema paranasal sinuses  CT C spine wo contrast IMPRESSION: 1. Negative for cervical spine fracture. 2. Fracture right anterior body of the mandible was present previously. There is progressive bone resorption at the fracture site. Mild displacement of the fracture. 3. Fracture left posterior body of the mandible with marked displacement, unchanged from the prior study. 4. Mild fracture right  nasal bone probably chronic and  unchanged. 5. Progressive mucosal edema paranasal sinuses.   Discharge Medications:  Allergies as of 08/03/2022   No Known Allergies      Medication List     STOP taking these medications    amoxicillin 250 MG/5ML suspension Commonly known as: AMOXIL       TAKE these medications    amoxicillin-clavulanate 875-125 MG tablet Commonly known as: AUGMENTIN Take 1 tablet by mouth 2 (two) times daily for 7 days.   chlorhexidine 0.12 % solution Commonly known as: Peridex Use as directed 15 mLs in the mouth or throat 4 (four) times daily for 7 days.   HYDROcodone-acetaminophen 5-325 MG tablet Commonly known as: NORCO/VICODIN Take 1 tablet by mouth every 6 (six) hours as needed for up to 10 days for moderate pain (Pain not relieved by ibuprofen).   ibuprofen 600 MG tablet Commonly known as: ADVIL Take 1 tablet (600 mg total) by mouth every 6 (six) hours as needed for up to 14 days (Pain).       Discharge Instructions: Please refer to Patient Instructions section of EMR for full details.  Patient was counseled important signs and symptoms that should prompt return to medical care, changes in medications, dietary instructions, activity restrictions, and follow up appointments.   Follow-Up Appointments:   Darci Current, DO 08/03/2022, 12:43 PM PGY-1, [place]

## 2022-08-03 NOTE — Plan of Care (Signed)
°  Problem: Education: °Goal: Knowledge of General Education information will improve °Description: Including pain rating scale, medication(s)/side effects and non-pharmacologic comfort measures °Outcome: Progressing °  °Problem: Health Behavior/Discharge Planning: °Goal: Ability to manage health-related needs will improve °Outcome: Progressing °  °Problem: Clinical Measurements: °Goal: Ability to maintain clinical measurements within normal limits will improve °Outcome: Progressing °Goal: Diagnostic test results will improve °Outcome: Progressing °  °Problem: Skin Integrity: °Goal: Risk for impaired skin integrity will decrease °Outcome: Progressing °  °

## 2022-08-03 NOTE — Op Note (Signed)
FACIAL PLASTIC SURGERY OPERATIVE NOTE  Lerry Liner Campbell Date/Time of Admission: 07/31/2022  3:20 PM  CSN: [id number];MRN:[id number] Attending Provider: Latrelle Dodrill, MD Room/Bed: 5N25C/5N25C-01 DOB: 02-Sep-1991 Age: 31 y.o.   Pre-Op Diagnosis: Bilateral Mandible fractures Polysubstance abuse Homelessness  Post-Op Diagnosis: The same  Procedure:   Open reduction internal fixation of right mandibular body fracture - transcervical approach (CPT [phone]) 2.   Open reduction internal fixation of left mandibular angle fracture - transcervical approach (CPT [phone]) 3.   Maxillomandibular fixation (CPT [phone])   FINDINGS: - Severely displaced and comminuted right mandibular body fracture with basal triangle comminution; reduced with 1.0 7 hole simplification plate with 3 monocortical screws and with 2.84mm 10-hole load bearing plate with 7 transosteal screws and single fixation screw for basal triangle segment - Severely displaced left mandibular angle fracture with traumatic disruption of the inferior alveolar nerve; repaired with 1.4mm 4-hole simplification plate with 4 monocortical scres and 1.43mm 7-hole Curvilinear angle plate with 6 transosteal screws - Hybrid arch bars achieved normal occlusion ; all removed at end of procedure; release of MMF demonstrated normal maxillomandibular occlusion - Multiple absent dentition  Anesthesia: General  Surgeon(s): Mervin Kung, MD  Staff: Circulator: Cheryle Horsfall, RN Relief Circulator: Jeronimo Greaves, RN Relief Scrub: Jeneen Montgomery, Amy E Scrub Person: Carmela Rima Vendor Representative : Darlyn Chamber RN First Assistant: Jeronimo Greaves, RN  Implants: Implant Name Type Inv. Item Serial No. Manufacturer Lot No. LRB No. Used Action  BAR FIX PREFORMED OMNIMAX - [id number] Miscellaneous BAR FIX PREFORMED OMNIMAX  BIOMET ZIMMER MICROFIXATION  Bilateral 2 Implanted  SCREW BONE 2X7 CROSS DRIVE - [id number] Screw SCREW BONE  2X7 CROSS DRIVE  BIOMET ZIMMER MICROFIXATION  Bilateral 8 Implanted  PLATE LOCK STRT 4H 1/SCREW - [id number] Plate PLATE LOCK STRT 4H 1/SCREW  BIOMET ZIMMER MICROFIXATION  Bilateral 1 Implanted  PLATE LOCK SML ANGLE 1.6 GOLD - [id number] Plate PLATE LOCK SML ANGLE 1.6 GOLD  BIOMET ZIMMER MICROFIXATION  Bilateral 1 Implanted  SCREW LOCKING X-DR 2.3X8 - [id number] Screw SCREW LOCKING X-DR 2.3X8  BIOMET ZIMMER MICROFIXATION  Bilateral 2 Implanted  SCREW LOCKING X-DR 2.3X10 - [id number] Screw SCREW LOCKING X-DR 2.3X10  BIOMET ZIMMER MICROFIXATION  Bilateral 1 Implanted  SCREW LOCKING X-DR 2.3X12 - [id number] Screw SCREW LOCKING X-DR 2.3X12  BIOMET ZIMMER MICROFIXATION  Bilateral 1 Implanted  SCREW LOCKING X-DR 2.3X14 - [id number] Screw SCREW LOCKING X-DR 2.3X14  BIOMET ZIMMER MICROFIXATION  Bilateral 2 Implanted  SCREW LOCKING X-DR 2.3X16 - [id number] Screw SCREW LOCKING X-DR 2.3X16  BIOMET ZIMMER MICROFIXATION  Bilateral 3 Implanted  PLATE 7H STRAIGHT 1.0MM SILVER - [id number] Plate PLATE 7H STRAIGHT 1.0MM SILVER  BIOMET ZIMMER MICROFIXATION  Bilateral 1 Implanted  SCREW NON LOCK X-DR 2.0X5 - [id number] Screw SCREW NON LOCK X-DR 2.0X5  BIOMET ZIMMER MICROFIXATION  Bilateral 6 Implanted  SCREW NON LOCK X-DR 2.0X6 - [id number] Screw SCREW NON LOCK X-DR 2.0X6  BIOMET ZIMMER MICROFIXATION  Bilateral 1 Implanted  PLATE LOCK 20F STR 2.0 BLUE - [id number] Plate PLATE LOCK 83G STR 2.0 BLUE  BIOMET ZIMMER MICROFIXATION  Bilateral 1 Implanted  SCREW LOCKING X-DR 2.3X18 - [id number] Screw SCREW LOCKING X-DR 2.3X18  BIOMET ZIMMER MICROFIXATION  Bilateral 4 Implanted  SCREW LOCKING X-DR 2.0X6 - [id number] Screw SCREW LOCKING X-DR 2.0X6  BIOMET ZIMMER MICROFIXATION  Bilateral 1 Implanted  SCREW LOCKING X-DR 2.0X8 - [id number] Screw SCREW LOCKING X-DR 2.0X8  BIOMET ZIMMER MICROFIXATION  Bilateral 1 Implanted    Specimens: *  No specimens in log *  Complications: none  EBL: 30 ML  IVF: See anesthesia  report  Condition: stable  Indications for Procedure: Paul Campbell is a 31 year old male with history of polysubstance abuse (including crack cocaine), homelessness, who was seen in the ED for bilateral mandibular fractures by Dr. Roselee Nova 07/24/22 and scheduled for outpatient surgery 07/31/22. He did not follow up for surgery on 07/31/22, and presented later that day to the emergency room with complaint of facial pain after falling when trying to jump a fence. CT maxillofacial demonstrated ongoing bilateral comminuted / displaced mandibular body and angle fractures. The patient's drug induced altered mental status on 07/31/22 did not allow an informed consent discussion or general anesthesia. He was subsequently admitted for definitive surgery and presents now for management.   Informed consent was obtained. Risks of surgery were discussed in detail including pain, bleeding, infection, scarring, injury to the teeth, lips, gums, tongue, numbness, V3 injury, facial nerve injury, mal-union, non-union, hardware failure, hardware infection, malocclusion, need for further surgery, risks of general anesthesia. Despite these risks the patient requested to proceed with surgery.   Description of Operation:  The patient was identified in the preoperative area and consent confirmed in the chart.  He was brought to the operating room by the anesthetist and a preoperative huddle was performed confirming the patient's identity and procedure to be performed.  Once all were in agreement we proceeded with surgery.  General anesthesia was induced and the patient was intubated with a nasotracheal tube.  This was secured with a head drape and transseptal suture.  Patient's head was positioned on a doughnut and shoulder roll.  The patient was then turned 180 degrees from the anesthetist.  Due to the severity of the patient's mandibular fractures a transcervical open approach was required for optimal treatment.  An  apron incision was marked in the patient's pre-existing high cervical crease approximately 2 fingerbreadths below the level of the mandible bilaterally with the left one extending up towards the mastoid process.  The incision was anesthetized with 1% lidocaine 1 100,000 epinephrine.  Patient's oral cavity exam demonstrated displaced right mandibular body fracture unable to be reduced manually.  The wound of the mouth was cleansed with Peridex and toothbrush.  The patient was then prepped and draped in standard fashion for a procedure of this kind.  Final preoperative pause was performed and we proceed with surgery.  A smile retractor was placed in the mouth to examine the dentition.  After multiple attempts occlusion cannot be achieved, likely due to fibrous malunion of the mandibular fractures due to delay in treatment.  We proceeded with open approach to the bilateral mandible fractures  The apron incision was created with a 15 blade.  The platysma muscle was divided.  Double-pronged skin hooks were used to facilitate retraction and scalpel and Bovie were used to elevate a subplatysmal plane up towards the level of the mandible.  Starting on the left side a submandibular/retromandibular approach was dissected.  Blunt dissection was used to dissect onto the submandibular gland.  The facial vein was divided and reflected superiorly to protect the marginal mandibular nerve.  Small branches of the facial vein and artery were ligated with surgical clips and the facial artery was ligated as well.  Posteriorly the external jugular vein was divided and controlled with surgical clips.  Broad dissection plane was used to encountered the inferior body of the mandible.  With the marginal mandibular nerve reflected superiorly  and protected the Bovie was used to incise the periosteum of the body and angle.  [employer] retractors with the facilitation of the [employer]immer [employer] robotic arm were used to achieve adequate exposure.   #9 periosteal elevator and Frazier tip suction were used to dissect on the angle  fracture.  This was completely stepped off and bicortically displaced.  Callus was debrided.  The inferior alveolar nerve was found to be not intact due to the traumatic injury.  The severely displaced fractures were reduced using 2 large bone hooks.  Once all the callus was debrided we then moved onto the exposure of the right mandibular body fracture  Using a similar technique blunt dissection was used to encounter the submandibular gland.  The submandibular gland fascia was elevated superiorly the facial vein was left preserved posteriorly.  We then reflected the marginal mandibular nerve superiorly and encountered the inferior aspect of the mandibular periosteum.  This was divided with monopolar cautery.  We extended our dissection towards the midline reflecting the apron flap over the mandibular symphysis releasing the fascia here.  There was significant callus and inflammation along the fracture line.  The #9 and Frazier suction were used to dissect out the fracture line which was at the plane of the mental nerve.  The mental nerve was preserved and intact throughout the dissection.  Once the entire soft tissue envelope was was it was dissected off of the mandible for adequate exposure on the symphysis we ensured adequate debridement of the callus and move forward with maxillomandibular fixation.  Attention was turned to the oral cavity.  Using the [employer] hybrid maxillomandibular fixation system a maxillary arch bar and mandibular arch bar were secured with self drilling screws.  Care was taken to protect the dental roots.  Notably the right mandibular arch was unable to be fixated due to significant mobility and displacement.  Next normal occlusion was achieved after manually manipulating the mandibular arch and securing maxillomandibular fixation with rigid rubber band elastics.  Now attention was turned back to  the left mandibular angle open reduction internal fixation.  The angle was manually reduced and a superior border simplification plate was applied.  A 1.0 mm 4-hole plate was used to secure them in the superior border with 4 monocortical screws.  Next a rigid 1.6 millimeters mandibular angle plate was fashioned and contoured to the left mandibular angle and secured with 6 transosteal screws.  Attention was now turned to the right mandibular body for open reduction internal fixation.  This fracture had severe comminution including complete displacement of the basal triangle of the mandibular body.  This was debrided of callus and the basal triangle was found to be completely detached from the rest of the mandible.  To secure this in place a 1.0 mm 7 hole mini plate was fashioned along the inferior cortex of the mandible and secured in place with 3 monocortical screws to stabilize the mandible for the loadbearing plate.  Next a plate template was used to fashion a 2.0 mm 10 hole loadbearing plate along the body and symphysis.  This was stabilized with a bone clamp and 7 transosteal screws were applied to both the posterior and proximal segments as well as a single monocortical screw to the basal triangle segment.  Excellent anatomic reduction of both mandibular fractures was achieved.  Attention was turned in the mouth and the maxillomandibular fixation was released demonstrating normal occlusion with passive mobility of the mandible.  Due to this there was  no role for postoperative MMF and the hybrid system was then removed from the mandible as well as all of the self drilling screws.  The patient's neck was copiously irrigated with sterile saline and a 19 French bulb suction drain was placed in the left neck.  We then proceeded with closure using buried interrupted 3-0 Vicryl sutures for the platysma and deep dermal layers followed by running 5-0 nylon for the skin.  The patient was then turned back to the  anesthetist to extubated and brought to the recovery room in stable condition.  All counts were correct and final.  The patient will remain admitted overnight for pain control and bowel drain management.  He will likely be discharged on postoperative day 1.  Postoperative medications were prescribed and a follow-up visit has been scheduled for 1 week.   Electronically signed by:  Scarlette Ar, MD  Staff Physician Facial Plastic & Reconstructive Surgery Otolaryngology - Head and Neck Surgery [facility] [place]

## 2022-08-03 NOTE — Social Work (Addendum)
CSW notes patient is homeless and unfortunately at this time there is significant barriers to securing housing in the medical setting.   CSW has provided patient with information on resources to follow-up with in the community once they discharge. CSW notes patient arrived to the hospital with no current residence and at this time they are medically cleared they are appropriate to discharge back to their current living situation.   CSW called pt on phone to educate on options. CSW offered shelter resources. Pt said he didn't want that and hung up.   CSW received request from RN for bus pass. CSW sent bus pass by tube.  Emeterio Reeve, LCSW Clinical Social Worker

## 2022-08-03 NOTE — Progress Notes (Signed)
TOC meds given to primary RN Shanon Brow

## 2022-08-05 ENCOUNTER — Encounter (HOSPITAL_COMMUNITY): Payer: Self-pay | Admitting: Otolaryngology

## 2022-08-05 ENCOUNTER — Other Ambulatory Visit (HOSPITAL_COMMUNITY): Payer: Self-pay

## 2022-09-12 ENCOUNTER — Emergency Department (HOSPITAL_COMMUNITY)
Admission: EM | Admit: 2022-09-12 | Discharge: 2022-09-12 | Disposition: A | Discharge status: Home / Self Care | Payer: Self-pay | Attending: Emergency Medicine | Admitting: Emergency Medicine

## 2022-09-12 DIAGNOSIS — Z4802 Encounter for removal of sutures: Secondary | ICD-10-CM | POA: Insufficient documentation

## 2022-09-12 MED ORDER — IBUPROFEN 400 MG PO TABS
ORAL_TABLET | ORAL | Status: AC
  Filled 2022-09-12: qty 1

## 2022-09-12 MED ORDER — IBUPROFEN 200 MG PO TABS
ORAL_TABLET | ORAL | Status: AC
  Filled 2022-09-12: qty 1

## 2022-09-12 MED ORDER — IBUPROFEN 400 MG PO TABS
600.0000 mg | ORAL_TABLET | Freq: Once | ORAL | Status: AC
  Administered 2022-09-12: 600 mg via ORAL

## 2022-09-12 NOTE — ED Triage Notes (Signed)
Patient BIB [facility] to have sutures removed. Patient states sutures were done approximately two months ago.

## 2022-09-12 NOTE — Discharge Instructions (Signed)
Your running stitches were removed from your wound. Follow up with your ENT surgeon for further postoperative evaluation.

## 2022-09-12 NOTE — ED Provider Notes (Signed)
[facility] EMERGENCY DEPARTMENT Provider Note   CSN: [id number] Arrival date & time: 09/12/22  1449     History  Chief Complaint  Patient presents with   Suture / Staple Removal    Dock Baccam II is a 31 y.o. male.  31 y/o male presents for suture removal. Patient underwent ORIF of the mandible on 08/03/22. States he was supposed to get his sutures out after 2 weeks, but neglected to follow up. Has now been incarcerated x 2 weeks. States the stitches were irritating him and when he alerted the jail they sent him to the ED for evaluation.  The history is provided by the patient. No language interpreter was used.  Suture / Staple Removal       Home Medications Prior to Admission medications   Not on File      Allergies    Patient has no known allergies.    Review of Systems   Review of Systems Ten systems reviewed and are negative for acute change, except as noted in the HPI.    Physical Exam Updated Vital Signs BP (!) 142/85   Pulse 79   Temp 98.4 F (36.9 C) (Oral)   Resp 17   SpO2 99%  Physical Exam Vitals and nursing note reviewed.  Constitutional:      General: He is not in acute distress.    Appearance: He is well-developed. He is not diaphoretic.     Comments: Alert and nontoxic appearing  HENT:     Head: Normocephalic and atraumatic.     Comments: Operative wound to upper neck, mandibular angle with presence of nylon running sutures. Mild keloid to borders of wound associated with suture entrance/exit. No erythema, redness, discharge or purulence, crepitus.     Mouth/Throat:     Comments: Jaw opening without issues; speech clear. Eyes:     General: No scleral icterus.    Conjunctiva/sclera: Conjunctivae normal.  Neck:   Pulmonary:     Effort: Pulmonary effort is normal. No respiratory distress.  Musculoskeletal:        General: Normal range of motion.     Cervical back: Normal range of motion.  Skin:    General: Skin is  warm and dry.     Coloration: Skin is not pale.     Findings: No erythema or rash.  Neurological:     Mental Status: He is alert and oriented to person, place, and time.     Coordination: Coordination normal.  Psychiatric:        Behavior: Behavior normal.     ED Results / Procedures / Treatments   Labs (all labs ordered are listed, but only abnormal results are displayed) Labs Reviewed - No data to display  EKG None  Radiology No results found.  Procedures .Suture Removal  Date/Time: 09/12/2022 3:25 PM  Performed by: Antony Madura, PA-C Authorized by: Antony Madura, PA-C   Consent:    Consent obtained:  Verbal   Consent given by:  Patient   Risks, benefits, and alternatives were discussed: yes     Risks discussed:  Pain and bleeding   Alternatives discussed:  No treatment and alternative treatment Universal protocol:    Procedure explained and questions answered to patient or proxy's satisfaction: yes     Relevant documents present and verified: yes     Test results available: yes     Imaging studies available: yes     Immediately prior to procedure, a time out was called:  yes     Patient identity confirmed:  Verbally with patient and arm band Location:    Location:  Head/neck   Head/neck location:  Neck Procedure details:    Wound appearance:  Good wound healing, nontender, nonpurulent and no signs of infection   Number of sutures removed:  2 (running nylon suture that has previously split) Post-procedure details:    Post-removal:  No dressing applied   Procedure completion:  Tolerated well, no immediate complications     Medications Ordered in ED Medications - No data to display  ED Course/ Medical Decision Making/ A&P                           Medical Decision Making  This patient presents to the ED for concern of suture removal, this involves an extensive number of treatment options, and is a complaint that carries with it a high risk of complications  and morbidity.  The differential diagnosis includes routine wound healing vs postoperative cellulitis vs postoperative abscess   Co morbidities that complicate the patient evaluation  None    Additional history obtained:  Additional history obtained from presenting sheriff External records from outside source obtained and reviewed including operative note from 08/03/22   Cardiac Monitoring:  The patient was maintained on a cardiac monitor.  I personally viewed and interpreted the cardiac monitored which showed an underlying rhythm of: NSR   Medicines ordered and prescription drug management:  I have reviewed the patients home medicines and have made adjustments as needed   Problem List / ED Course:  Sutures removed without issue   Reevaluation:  After the interventions noted above, I reevaluated the patient and found that they have :stayed the same   Social Determinants of Health:  Incarcerated    Dispostion:  After consideration of the diagnostic results and the patients response to treatment, I feel that the patent would benefit from postoperative wound recheck with his surgeon. Return precautions discussed and provided. Patient discharged in stable condition with no unaddressed concerns.           Final Clinical Impression(s) / ED Diagnoses Final diagnoses:  Visit for suture removal    Rx / DC Orders ED Discharge Orders     None         Antony Madura, PA-C 09/12/22 1530    Pricilla Loveless, MD 09/13/22 [phone]

## 2022-11-12 ENCOUNTER — Other Ambulatory Visit: Payer: Self-pay | Admitting: Otolaryngology

## 2022-11-12 DIAGNOSIS — L0211 Cutaneous abscess of neck: Secondary | ICD-10-CM

## 2022-11-12 DIAGNOSIS — S02609S Fracture of mandible, unspecified, sequela: Secondary | ICD-10-CM
